# Patient Record
Sex: Female | Born: 1995 | Race: Black or African American | Hispanic: No | Marital: Single | Smoking: Never smoker
Health system: Southern US, Community
[De-identification: ages and names within clinical notes are randomized; demographics above are authoritative.]

## PROBLEM LIST (undated history)

## (undated) DIAGNOSIS — N912 Amenorrhea, unspecified: Secondary | ICD-10-CM

## (undated) DIAGNOSIS — D649 Anemia, unspecified: Secondary | ICD-10-CM

## (undated) DIAGNOSIS — Z8619 Personal history of other infectious and parasitic diseases: Secondary | ICD-10-CM

## (undated) DIAGNOSIS — Z789 Other specified health status: Secondary | ICD-10-CM

## (undated) DIAGNOSIS — B009 Herpesviral infection, unspecified: Secondary | ICD-10-CM

## (undated) HISTORY — DX: Other specified health status: Z78.9

## (undated) HISTORY — DX: Anemia, unspecified: D64.9

## (undated) HISTORY — DX: Herpesviral infection, unspecified: B00.9

## (undated) HISTORY — DX: Personal history of other infectious and parasitic diseases: Z86.19

## (undated) HISTORY — PX: NO PAST SURGERIES: SHX2092

## (undated) HISTORY — DX: Amenorrhea, unspecified: N91.2

---

## 2013-07-13 ENCOUNTER — Inpatient Hospital Stay: Payer: Self-pay | Admitting: Obstetrics and Gynecology

## 2013-07-13 LAB — CBC WITH DIFFERENTIAL/PLATELET
BASOS ABS: 0.1 10*3/uL (ref 0.0–0.1)
Basophil %: 0.6 %
EOS ABS: 0 10*3/uL (ref 0.0–0.7)
Eosinophil %: 0.3 %
HCT: 33.2 % — ABNORMAL LOW (ref 35.0–47.0)
HGB: 11.4 g/dL — AB (ref 12.0–16.0)
LYMPHS ABS: 1.6 10*3/uL (ref 1.0–3.6)
Lymphocyte %: 15.7 %
MCH: 24.4 pg — ABNORMAL LOW (ref 26.0–34.0)
MCHC: 34.3 g/dL (ref 32.0–36.0)
MCV: 71 fL — AB (ref 80–100)
MONOS PCT: 6.2 %
Monocyte #: 0.6 x10 3/mm (ref 0.2–0.9)
NEUTROS ABS: 7.7 10*3/uL — AB (ref 1.4–6.5)
NEUTROS PCT: 77.2 %
Platelet: 215 10*3/uL (ref 150–440)
RBC: 4.67 10*6/uL (ref 3.80–5.20)
RDW: 15.3 % — AB (ref 11.5–14.5)
WBC: 10 10*3/uL (ref 3.6–11.0)

## 2013-07-13 LAB — CREATININE, URINE, RANDOM: Creatinine, Urine Random: 177 mg/dL — ABNORMAL HIGH (ref 30.0–125.0)

## 2013-07-13 LAB — BASIC METABOLIC PANEL
Anion Gap: 6 — ABNORMAL LOW (ref 7–16)
BUN: 9 mg/dL (ref 7–18)
CALCIUM: 8.8 mg/dL — AB (ref 9.0–10.7)
CO2: 23 mmol/L (ref 21–32)
Chloride: 107 mmol/L (ref 98–107)
Creatinine: 0.66 mg/dL (ref 0.60–1.30)
EGFR (African American): >60
EGFR (Non-African Amer.): >60
Glucose: 71 mg/dL (ref 65–99)
Osmolality: 269 (ref 275–301)
Potassium: 4.2 mmol/L (ref 3.5–5.1)
Sodium: 136 mmol/L (ref 136–145)

## 2013-07-13 LAB — SGOT (AST)(ARMC): AST: 30 U/L — AB (ref 0–26)

## 2013-07-13 LAB — GC/CHLAMYDIA PROBE AMP

## 2013-07-13 LAB — PROTEIN, URINE, RANDOM: PROTEIN, RANDOM URINE: 39 mg/dL — AB (ref 0–12)

## 2013-07-13 LAB — URIC ACID: URIC ACID: 3.8 mg/dL (ref 3.0–5.8)

## 2013-07-14 LAB — HEMATOCRIT: HCT: 27.4 % — ABNORMAL LOW (ref 35.0–47.0)

## 2014-09-05 NOTE — H&P (Signed)
L&D Evaluation:  History:  HPI 19 y/[facility] through the night and bloody show. Denies leaking fluid, baby is active. Adolescent, Anemia, +CMZ treated 01/28/13, +RPR titer 1:2. NR-FTA and ANA Antibodies. GBS+ urine and culture   Presents with contractions   Patient's Medical History No Chronic Illness   Patient's Surgical History none   Medications Pre Natal Vitamins   Allergies NKDA   Social History none   Family History Non-Contributory   ROS:  ROS All systems were reviewed.  HEENT, CNS, GI, GU, Respiratory, CV, Renal and Musculoskeletal systems were found to be normal.   Exam:  Vital Signs stable   Urine Protein not completed   General no apparent distress   Mental Status clear   Chest clear   Heart normal sinus rhythm   Abdomen gravid, non-tender   Estimated Fetal Weight Average for gestational age   Fetal Position vtx   Fundal Height term   Back no CVAT   Edema 1+  pedal   Reflexes 1+   Clonus negative   Pelvic no external lesions, 3-4cm BBOW bloody show present VTX @ -2   Mebranes Intact   FHT normal rate with no decels, 120's-130's avg variability with accels   Fetal Heart Rate 136   Ucx irregular, Q2/3 mins 45/60 sec moderate   Skin dry   Lymph no lymphadenopathy   Impression:  Impression early labor, evaluation for PIH   Plan:  Plan monitor contractions and for cervical change, PIH panel, antibiotics for GBBS prophylaxis   Comments PIH labs nl, IV ABX infusing. UC's mild, comfortable for now. DC pain management options available with labor progress. Mom and FOB present and supportive.   Electronic Signatures: Albertina ParrLugiano, Kaleena Corrow B (CNM)  (Signed 18-Mar-15 12:58)  Authored: L&D Evaluation   Last Updated: 18-Mar-15 12:58 by Albertina ParrLugiano, Jashira Cotugno B (CNM)

## 2015-04-29 NOTE — L&D Delivery Note (Signed)
Delivery Note At 8:20 AM a viable female was delivered via Vaginal, Spontaneous Delivery (Presentation: LOA).  APGAR: 8, 9; weight pending.   Placenta status: spontaneous, intact.  Cord: 3vc with the following complications: nuchal cord.   Anesthesia:  fentanyl Episiotomy:  none Lacerations:  periurethral Suture Repair: 3.0 vicryl Est. Blood Loss (mL):  250  Mom to postpartum.  Baby to Couplet care / Skin to Skin.  STINSON, JACOB JEHIEL 03/11/2016, 8:34 AM

## 2015-07-25 ENCOUNTER — Ambulatory Visit (INDEPENDENT_AMBULATORY_CARE_PROVIDER_SITE_OTHER): Payer: Self-pay | Admitting: Obstetrics and Gynecology

## 2015-07-25 VITALS — BP 117/62 | HR 87 | Ht 62.0 in | Wt 123.5 lb

## 2015-07-25 DIAGNOSIS — Z36 Encounter for antenatal screening of mother: Secondary | ICD-10-CM

## 2015-07-25 DIAGNOSIS — Z113 Encounter for screening for infections with a predominantly sexual mode of transmission: Secondary | ICD-10-CM

## 2015-07-25 DIAGNOSIS — Z3490 Encounter for supervision of normal pregnancy, unspecified, unspecified trimester: Secondary | ICD-10-CM

## 2015-07-25 DIAGNOSIS — N912 Amenorrhea, unspecified: Secondary | ICD-10-CM

## 2015-07-25 DIAGNOSIS — Z369 Encounter for antenatal screening, unspecified: Secondary | ICD-10-CM

## 2015-07-25 DIAGNOSIS — Z331 Pregnant state, incidental: Secondary | ICD-10-CM

## 2015-07-25 DIAGNOSIS — Z1389 Encounter for screening for other disorder: Secondary | ICD-10-CM

## 2015-07-25 LAB — POCT URINE PREGNANCY: PREG TEST UR: POSITIVE — AB

## 2015-07-25 NOTE — Patient Instructions (Signed)
Pregnancy and Zika Virus Disease Zika virus disease, or Zika, is an illness that can spread to people from mosquitoes that carry the virus. It may also spread from person to person through infected body fluids. Zika first occurred in [place], but recently it has spread to new areas. The virus occurs in tropical climates. The location of Zika continues to change. Most people who become infected with Zika virus do not develop serious illness. However, Zika may cause birth defects in an unborn baby whose mother is infected with the virus. It may also increase the risk of miscarriage. WHAT ARE THE SYMPTOMS OF ZIKA VIRUS DISEASE? In many cases, people who have been infected with Zika virus do not develop any symptoms. If symptoms appear, they usually start about a week after the person is infected. Symptoms are usually mild. They may include:  Fever.  Rash.  Red eyes.  Joint pain. HOW DOES ZIKA VIRUS DISEASE SPREAD? The main way that Zika virus spreads is through the bite of a certain type of mosquito. Unlike most types of mosquitos, which bite only at night, the type of mosquito that carries Zika virus bites both at night and during the day. Zika virus can also spread through sexual contact, through a blood transfusion, and from a mother to her baby before or during birth. Once you have had Zika virus disease, it is unlikely that you will get it again. CAN I PASS ZIKA TO MY BABY DURING PREGNANCY? Yes, Zika can pass from a mother to her baby before or during birth. WHAT PROBLEMS CAN ZIKA CAUSE FOR MY BABY? A woman who is infected with Zika virus while pregnant is at risk of having her baby born with a condition in which the brain or head is smaller than expected (microcephaly). Babies who have microcephaly can have developmental delays, seizures, hearing problems, and vision problems. Having Zika virus disease during pregnancy can also increase the risk of miscarriage. HOW CAN ZIKA VIRUS DISEASE BE  PREVENTED? There is no vaccine to prevent Zika. The best way to prevent the disease is to avoid infected mosquitoes and avoid exposure to body fluids that can spread the virus. Avoid any possible exposure to Zika by taking the following precautions. For women and their sex partners:  Avoid traveling to high-risk areas. The locations where Zika is being reported change often. To identify high-risk areas, check the CDC travel website: www.cdc.gov/zika/geo/index.html  If you or your sex partner must travel to a high-risk area, talk with a health care provider before and after traveling.  Take all precautions to avoid mosquito bites if you live in, or travel to, any of the high-risk areas. Insect repellents are safe to use during pregnancy.  Ask your health care provider when it is safe to have sexual contact. For women:  If you are pregnant or trying to become pregnant, avoid sexual contact with persons who may have been exposed to Zika virus, persons who have possible symptoms of Zika, or persons whose history you are unsure about. If you choose to have sexual contact with someone who may have been exposed to Zika virus, use condoms correctly during the entire duration of sexual activity, every time. Do not share sexual devices, as you may be exposed to body fluids.  Ask your health care provider about when it is safe to attempt pregnancy after a possible exposure to Zika virus. WHAT STEPS SHOULD I TAKE TO AVOID MOSQUITO BITES? Take these steps to avoid mosquito bites when you are   in a high-risk area:  Wear loose clothing that covers your arms and legs.  Limit your outdoor activities.  Do not open windows unless they have window screens.  Sleep under mosquito nets.  Use insect repellent. The best insect repellents have:  DEET, picaridin, oil of lemon eucalyptus (OLE), or IR3535 in them.  Higher amounts of an active ingredient in them.  Remember that insect repellents are safe to use  during pregnancy.  Do not use OLE on children who are younger than 3 years of age. Do not use insect repellent on babies who are younger than 2 months of age.  Cover your child's stroller with mosquito netting. Make sure the netting fits snugly and that any loose netting does not cover your child's mouth or nose. Do not use a blanket as a mosquito-protection cover.  Do not apply insect repellent underneath clothing.  If you are using sunscreen, apply the sunscreen before applying the insect repellent.  Treat clothing with permethrin. Do not apply permethrin directly to your skin. Follow label directions for safe use.  Get rid of standing water, where mosquitoes may reproduce. Standing water is often found in items such as buckets, bowls, animal food dishes, and flowerpots. When you return from traveling to any high-risk area, continue taking actions to protect yourself against mosquito bites for 3 weeks, even if you show no signs of illness. This will prevent spreading Zika virus to uninfected mosquitoes. WHAT SHOULD I KNOW ABOUT THE SEXUAL TRANSMISSION OF ZIKA? People can spread Zika to their sexual partners during vaginal, anal, or oral sex, or by sharing sexual devices. Many people with Zika do not develop symptoms, so a person could spread the disease without knowing that they are infected. The greatest risk is to women who are pregnant or who may become pregnant. Zika virus can live longer in semen than it can live in blood. Couples can prevent sexual transmission of the virus by:  Using condoms correctly during the entire duration of sexual activity, every time. This includes vaginal, anal, and oral sex.  Not sharing sexual devices. Sharing increases your risk of being exposed to body fluid from another person.  Avoiding all sexual activity until your health care provider says it is safe. SHOULD I BE TESTED FOR ZIKA VIRUS? A sample of your blood can be tested for Zika virus. A pregnant  woman should be tested if she may have been exposed to the virus or if she has symptoms of Zika. She may also have additional tests done during her pregnancy, such ultrasound testing. Talk with your health care provider about which tests are recommended.   This information is not intended to replace advice given to you by your health care provider. Make sure you discuss any questions you have with your health care provider.   Document Released: 01/03/2015 Document Reviewed: 12/27/2014 Elsevier Interactive Patient Education 2016 Elsevier Inc. Minor Illnesses and Medications in Pregnancy  Cold/Flu:  Sudafed for congestion- Robitussin (plain) for cough- Tylenol for discomfort.  Please follow the directions on the label.  Try not to take any more than needed.  OTC Saline nasal spray and air humidifier or cool-mist  Vaporizer to sooth nasal irritation and to loosen congestion.  It is also important to increase intake of non carbonated fluids, especially if you have a fever.  Constipation:  Colace-2 capsules at bedtime; Metamucil- follow directions on label; Senokot- 1 tablet at bedtime.  Any one of these medications can be used.  It is also   very important to increase fluids and fruits along with regular exercise.  If problem persists please call the office.  Diarrhea:  Kaopectate as directed on the label.  Eat a bland diet and increase fluids.  Avoid highly seasoned foods.  Headache:  Tylenol 1 or 2 tablets every 3-4 hours as needed  Indigestion:  Maalox, Mylanta, Tums or Rolaids- as directed on label.  Also try to eat small meals and avoid fatty, greasy or spicy foods.  Nausea with or without Vomiting:  Nausea in pregnancy is caused by increased levels of hormones in the body which influence the digestive system and cause irritation when stomach acids accumulate.  Symptoms usually subside after 1st trimester of pregnancy.  Try the following:  Keep saltines, graham crackers or dry toast by your bed  to eat upon awakening.  Don't let your stomach get empty.  Try to eat 5-6 small meals per day instead of 3 large ones.  Avoid greasy fatty or highly seasoned foods.   Take OTC Unisom 1 tablet at bed time along with OTC Vitamin B6 25-50 mg 3 times per day.    If nausea continues with vomiting and you are unable to keep down food and fluids you may need a prescription medication.  Please notify your provider.   Sore throat:  Chloraseptic spray, throat lozenges and or plain Tylenol.  Vaginal Yeast Infection:  OTC Monistat for 7 days as directed on label.  If symptoms do not resolve within a week notify provider.  If any of the above problems do not subside with recommended treatment please call the office for further assistance.   Do not take Aspirin, Advil, Motrin or Ibuprofen.  * * OTC= Over the counter Hyperemesis Gravidarum Hyperemesis gravidarum is a severe form of nausea and vomiting that happens during pregnancy. Hyperemesis is worse than morning sickness. It may cause you to have nausea or vomiting all day for many days. It may keep you from eating and drinking enough food and liquids. Hyperemesis usually occurs during the first half (the first 20 weeks) of pregnancy. It often goes away once a woman is in her second half of pregnancy. However, sometimes hyperemesis continues through an entire pregnancy.  CAUSES  The cause of this condition is not completely known but is thought to be related to changes in the body's hormones when pregnant. It could be from the high level of the pregnancy hormone or an increase in estrogen in the body.  SIGNS AND SYMPTOMS   Severe nausea and vomiting.  Nausea that does not go away.  Vomiting that does not allow you to keep any food down.  Weight loss and body fluid loss (dehydration).  Having no desire to eat or not liking food you have previously enjoyed. DIAGNOSIS  Your health care provider will do a physical exam and ask you about your symptoms.  He or she may also order blood tests and urine tests to make sure something else is not causing the problem.  TREATMENT  You may only need medicine to control the problem. If medicines do not control the nausea and vomiting, you will be treated in the hospital to prevent dehydration, increased acid in the blood (acidosis), weight loss, and changes in the electrolytes in your body that may harm the unborn baby (fetus). You may need IV fluids.  HOME CARE INSTRUCTIONS   Only take over-the-counter or prescription medicines as directed by your health care provider.  Try eating a couple of dry crackers or   toast in the morning before getting out of bed.  Avoid foods and smells that upset your stomach.  Avoid fatty and spicy foods.  Eat 5-6 small meals a day.  Do not drink when eating meals. Drink between meals.  For snacks, eat high-protein foods, such as cheese.  Eat or suck on things that have ginger in them. Ginger helps nausea.  Avoid food preparation. The smell of food can spoil your appetite.  Avoid iron pills and iron in your multivitamins until after 3-4 months of being pregnant. However, consult with your health care provider before stopping any prescribed iron pills. SEEK MEDICAL CARE IF:   Your abdominal pain increases.  You have a severe headache.  You have vision problems.  You are losing weight. SEEK IMMEDIATE MEDICAL CARE IF:   You are unable to keep fluids down.  You vomit blood.  You have constant nausea and vomiting.  You have excessive weakness.  You have extreme thirst.  You have dizziness or fainting.  You have a fever or persistent symptoms for more than 2-3 days.  You have a fever and your symptoms suddenly get worse. MAKE SURE YOU:   Understand these instructions.  Will watch your condition.  Will get help right away if you are not doing well or get worse.   This information is not intended to replace advice given to you by your health care  provider. Make sure you discuss any questions you have with your health care provider.   Document Released: 04/14/2005 Document Revised: 02/02/2013 Document Reviewed: 11/24/2012 Elsevier Interactive Patient Education 2016 Elsevier Inc. Commonly Asked Questions During Pregnancy  Cats: A parasite can be excreted in cat feces.  To avoid exposure you need to have another person empty the little box.  If you must empty the litter box you will need to wear gloves.  Wash your hands after handling your cat.  This parasite can also be found in raw or undercooked meat so this should also be avoided.  Colds, Sore Throats, Flu: Please check your medication sheet to see what you can take for symptoms.  If your symptoms are unrelieved by these medications please call the office.  Dental Work: Most any dental work your dentist recommends is permitted.  X-rays should only be taken during the first trimester if absolutely necessary.  Your abdomen should be shielded with a lead apron during all x-rays.  Please notify your provider prior to receiving any x-rays.  Novocaine is fine; gas is not recommended.  If your dentist requires a note from us prior to dental work please call the office and we will provide one for you.  Exercise: Exercise is an important part of staying healthy during your pregnancy.  You may continue most exercises you were accustomed to prior to pregnancy.  Later in your pregnancy you will most likely notice you have difficulty with activities requiring balance like riding a bicycle.  It is important that you listen to your body and avoid activities that put you at a higher risk of falling.  Adequate rest and staying well hydrated are a must!  If you have questions about the safety of specific activities ask your provider.    Exposure to Children with illness: Try to avoid obvious exposure; report any symptoms to us when noted,  If you have chicken pos, red measles or mumps, you should be immune to  these diseases.   Please do not take any vaccines while pregnant unless you have checked with   your OB provider.  Fetal Movement: After 28 weeks we recommend you do "kick counts" twice daily.  Lie or sit down in a calm quiet environment and count your baby movements "kicks".  You should feel your baby at least 10 times per hour.  If you have not felt 10 kicks within the first hour get up, walk around and have something sweet to eat or drink then repeat for an additional hour.  If count remains less than 10 per hour notify your provider.  Fumigating: Follow your pest control agent's advice as to how long to stay out of your home.  Ventilate the area well before re-entering.  Hemorrhoids:   Most over-the-counter preparations can be used during pregnancy.  Check your medication to see what is safe to use.  It is important to use a stool softener or fiber in your diet and to drink lots of liquids.  If hemorrhoids seem to be getting worse please call the office.   Hot Tubs:  Hot tubs Jacuzzis and saunas are not recommended while pregnant.  These increase your internal body temperature and should be avoided.  Intercourse:  Sexual intercourse is safe during pregnancy as long as you are comfortable, unless otherwise advised by your provider.  Spotting may occur after intercourse; report any bright red bleeding that is heavier than spotting.  Labor:  If you know that you are in labor, please go to the hospital.  If you are unsure, please call the office and let us help you decide what to do.  Lifting, straining, etc:  If your job requires heavy lifting or straining please check with your provider for any limitations.  Generally, you should not lift items heavier than that you can lift simply with your hands and arms (no back muscles)  Painting:  Paint fumes do not harm your pregnancy, but may make you ill and should be avoided if possible.  Latex or water based paints have less odor than oils.  Use adequate  ventilation while painting.  Permanents & Hair Color:  Chemicals in hair dyes are not recommended as they cause increase hair dryness which can increase hair loss during pregnancy.  " Highlighting" and permanents are allowed.  Dye may be absorbed differently and permanents may not hold as well during pregnancy.  Sunbathing:  Use a sunscreen, as skin burns easily during pregnancy.  Drink plenty of fluids; avoid over heating.  Tanning Beds:  Because their possible side effects are still unknown, tanning beds are not recommended.  Ultrasound Scans:  Routine ultrasounds are performed at approximately 20 weeks.  You will be able to see your baby's general anatomy an if you would like to know the gender this can usually be determined as well.  If it is questionable when you conceived you may also receive an ultrasound early in your pregnancy for dating purposes.  Otherwise ultrasound exams are not routinely performed unless there is a medical necessity.  Although you can request a scan we ask that you pay for it when conducted because insurance does not cover " patient request" scans.  Work: If your pregnancy proceeds without complications you may work until your due date, unless your physician or employer advises otherwise.  Round Ligament Pain/Pelvic Discomfort:  Sharp, shooting pains not associated with bleeding are fairly common, usually occurring in the second trimester of pregnancy.  They tend to be worse when standing up or when you remain standing for long periods of time.  These are the result   of pressure of certain pelvic ligaments called "round ligaments".  Rest, Tylenol and heat seem to be the most effective relief.  As the womb and fetus grow, they rise out of the pelvis and the discomfort improves.  Please notify the office if your pain seems different than that described.  It may represent a more serious condition.   

## 2015-07-25 NOTE — Progress Notes (Signed)
Tracy BarcelonaQuadasia L Moses presents for NOB nurse interview visit. G-2.  P-1001. Had pregnancy confirmation at [facility] but forgot it. UPT here at office Positive. Pregnancy education material explained and given. No cats in the home. NOB labs ordered.  HIV labs and Drug screen were explained optional and she could opt out of tests but did not decline. Drug screen ordered. PNV encouraged. NT to discuss with provider. Pt c/o nausea and was told to take B6 3xd and Unisom at night, given diet instructions such as snacks, etc. and if this does not help to contact office. Pt. To follow up with provider in 4 weeks for NOB physical.  All questions answered.   ZIKA EXPOSURE SCREEN:  The patient has not traveled to a [place] endemic area within the past 6 months, nor has she had unprotected sex with a partner who has travelled to a [place] endemic region within the past 6 months. The patient has been advised to notify us if these factors change any time during this current pregnancy, so adequate testing and monitoring can be initiated.

## 2015-07-26 ENCOUNTER — Other Ambulatory Visit: Payer: Self-pay | Admitting: Obstetrics and Gynecology

## 2015-07-26 DIAGNOSIS — O9989 Other specified diseases and conditions complicating pregnancy, childbirth and the puerperium: Principal | ICD-10-CM

## 2015-07-26 DIAGNOSIS — O09899 Supervision of other high risk pregnancies, unspecified trimester: Secondary | ICD-10-CM

## 2015-07-26 DIAGNOSIS — Z9289 Personal history of other medical treatment: Secondary | ICD-10-CM | POA: Insufficient documentation

## 2015-07-26 DIAGNOSIS — Z283 Underimmunization status: Secondary | ICD-10-CM

## 2015-07-27 LAB — CBC WITH DIFFERENTIAL/PLATELET
Basophils Absolute: 0 10*3/uL (ref 0.0–0.2)
Basos: 0 %
EOS (ABSOLUTE): 0 10*3/uL (ref 0.0–0.4)
Eos: 0 %
Hematocrit: 32.8 % — ABNORMAL LOW (ref 34.0–46.6)
Hemoglobin: 10.5 g/dL — ABNORMAL LOW (ref 11.1–15.9)
IMMATURE GRANULOCYTES: 0 %
Immature Grans (Abs): 0 10*3/uL (ref 0.0–0.1)
LYMPHS ABS: 2.5 10*3/uL (ref 0.7–3.1)
Lymphs: 35 %
MCH: 22.5 pg — ABNORMAL LOW (ref 26.6–33.0)
MCHC: 32 g/dL (ref 31.5–35.7)
MCV: 70 fL — AB (ref 79–97)
MONOS ABS: 0.5 10*3/uL (ref 0.1–0.9)
Monocytes: 6 %
Neutrophils Absolute: 4.2 10*3/uL (ref 1.4–7.0)
Neutrophils: 59 %
PLATELETS: 237 10*3/uL (ref 150–379)
RBC: 4.66 x10E6/uL (ref 3.77–5.28)
RDW: 15.4 % (ref 12.3–15.4)
WBC: 7.2 10*3/uL (ref 3.4–10.8)

## 2015-07-27 LAB — PAIN MGT SCRN (14 DRUGS), UR
Amphetamine Screen, Ur: NEGATIVE ng/mL
BARBITURATE SCRN UR: NEGATIVE ng/mL
BENZODIAZEPINE SCREEN, URINE: NEGATIVE ng/mL
BUPRENORPHINE, URINE: NEGATIVE ng/mL
CANNABINOIDS UR QL SCN: POSITIVE ng/mL
COCAINE(METAB.) SCREEN, URINE: NEGATIVE ng/mL
Creatinine(Crt), U: 114.4 mg/dL (ref 20.0–300.0)
Fentanyl, Urine: NEGATIVE pg/mL
MEPERIDINE SCREEN, URINE: NEGATIVE ng/mL
METHADONE SCREEN, URINE: NEGATIVE ng/mL
OPIATE SCRN UR: NEGATIVE ng/mL
Oxycodone+Oxymorphone Ur Ql Scn: NEGATIVE ng/mL
PCP Scrn, Ur: NEGATIVE ng/mL
PROPOXYPHENE SCREEN: NEGATIVE ng/mL
Ph of Urine: 8.6 (ref 4.5–8.9)
Tramadol Ur Ql Scn: NEGATIVE ng/mL

## 2015-07-27 LAB — URINALYSIS, ROUTINE W REFLEX MICROSCOPIC
BILIRUBIN UA: NEGATIVE
GLUCOSE, UA: NEGATIVE
KETONES UA: NEGATIVE
Leukocytes, UA: NEGATIVE
NITRITE UA: NEGATIVE
RBC UA: NEGATIVE
SPEC GRAV UA: 1.026 (ref 1.005–1.030)
UUROB: 1 mg/dL (ref 0.2–1.0)
pH, UA: 8 — ABNORMAL HIGH (ref 5.0–7.5)

## 2015-07-27 LAB — HEPATITIS B SURFACE ANTIGEN: Hepatitis B Surface Ag: NEGATIVE

## 2015-07-27 LAB — RPR: RPR Ser Ql: REACTIVE — AB

## 2015-07-27 LAB — NICOTINE SCREEN, URINE: COTININE UR QL SCN: NEGATIVE ng/mL

## 2015-07-27 LAB — GC/CHLAMYDIA PROBE AMP
CHLAMYDIA, DNA PROBE: NEGATIVE
NEISSERIA GONORRHOEAE BY PCR: NEGATIVE

## 2015-07-27 LAB — RH TYPE: Rh Factor: POSITIVE

## 2015-07-27 LAB — HIV ANTIBODY (ROUTINE TESTING W REFLEX): HIV Screen 4th Generation wRfx: NONREACTIVE

## 2015-07-27 LAB — RUBELLA ANTIBODY, IGM: Rubella IgM: <20 AU/mL (ref 0.0–19.9)

## 2015-07-27 LAB — ABO

## 2015-07-27 LAB — RPR, QUANT+TP ABS (REFLEX): T Pallidum Abs: NEGATIVE

## 2015-07-27 LAB — URINE CULTURE, OB REFLEX

## 2015-07-27 LAB — VARICELLA ZOSTER ANTIBODY, IGM: VARICELLA IGM: 1.48 {index} — AB (ref 0.00–0.90)

## 2015-07-27 LAB — CULTURE, OB URINE

## 2015-07-27 LAB — SICKLE CELL SCREEN: SICKLE CELL SCREEN: NEGATIVE

## 2015-07-27 LAB — ANTIBODY SCREEN: Antibody Screen: NEGATIVE

## 2015-07-30 ENCOUNTER — Emergency Department
Admission: EM | Admit: 2015-07-30 | Discharge: 2015-07-30 | Disposition: A | Discharge status: Home / Self Care | Payer: BLUE CROSS/BLUE SHIELD | Attending: Emergency Medicine | Admitting: Emergency Medicine

## 2015-07-30 ENCOUNTER — Encounter: Payer: Self-pay | Admitting: Emergency Medicine

## 2015-07-30 DIAGNOSIS — J029 Acute pharyngitis, unspecified: Secondary | ICD-10-CM | POA: Diagnosis present

## 2015-07-30 DIAGNOSIS — J0100 Acute maxillary sinusitis, unspecified: Secondary | ICD-10-CM | POA: Diagnosis not present

## 2015-07-30 DIAGNOSIS — H6123 Impacted cerumen, bilateral: Secondary | ICD-10-CM | POA: Diagnosis not present

## 2015-07-30 LAB — POCT RAPID STREP A: Streptococcus, Group A Screen (Direct): NEGATIVE

## 2015-07-30 MED ORDER — LIDOCAINE VISCOUS 2 % MT SOLN: 5.0000 mL | Freq: Four times a day (QID) | OROMUCOSAL | Status: DC | PRN

## 2015-07-30 MED ORDER — AMOXICILLIN 500 MG PO CAPS: 500.0000 mg | ORAL_CAPSULE | Freq: Three times a day (TID) | ORAL | Status: DC

## 2015-07-30 MED ORDER — PSEUDOEPH-BROMPHEN-DM 30-2-10 MG/5ML PO SYRP: 5.0000 mL | ORAL_SOLUTION | Freq: Four times a day (QID) | ORAL | Status: DC | PRN

## 2015-07-30 NOTE — Discharge Instructions (Signed)
Cerumen Impaction The structures of the external ear canal secrete a waxy substance known as cerumen. Excess cerumen can build up in the ear canal, causing a condition known as cerumen impaction. Cerumen impaction can cause ear pain and disrupt the function of the ear. The rate of cerumen production differs for each individual. In certain individuals, the configuration of the ear canal may decrease his or her ability to naturally remove cerumen. CAUSES Cerumen impaction is caused by excessive cerumen production or buildup. RISK FACTORS  Frequent use of swabs to clean ears.  Having narrow ear canals.  Having eczema.  Being dehydrated. SIGNS AND SYMPTOMS  Diminished hearing.  Ear drainage.  Ear pain.  Ear itch. TREATMENT Treatment may involve:  Over-the-counter or prescription ear drops to soften the cerumen.  Removal of cerumen by a health care provider. This may be done with:  Irrigation with warm water. This is the most common method of removal.  Ear curettes and other instruments.  Surgery. This may be done in severe cases. HOME CARE INSTRUCTIONS  Take medicines only as directed by your health care provider.  Do not insert objects into the ear with the intent of cleaning the ear. PREVENTION  Do not insert objects into the ear, even with the intent of cleaning the ear. Removing cerumen as a part of normal hygiene is not necessary, and the use of swabs in the ear canal is not recommended.  Drink enough water to keep your urine clear or pale yellow.  Control your eczema if you have it. SEEK MEDICAL CARE IF:  You develop ear pain.  You develop bleeding from the ear.  The cerumen does not clear after you use ear drops as directed.   This information is not intended to replace advice given to you by your health care provider. Make sure you discuss any questions you have with your health care provider.   Document Released: 05/22/2004 Document Revised: 05/05/2014  Document Reviewed: 11/29/2014 Elsevier Interactive Patient Education 2016 [employer]vier Inc.  

## 2015-07-30 NOTE — ED Notes (Signed)
Reports sore throat and right ear pain

## 2015-07-30 NOTE — ED Provider Notes (Signed)
[facility] Emergency Department Provider Note  ____________________________________________  Time seen: Approximately 11:31 AM  I have reviewed the triage vital signs and the nursing notes.   HISTORY  Chief Complaint Sore Throat    HPI Tracy Moses is a 20 y.o. female patient complaining of sinus congestion, right ear pain and sore throat5 days. Patient also states that she's having hearing loss which she described as"muffle" sounds. Patient denies any fever with this complaint. Patient denies any nausea vomiting diarrhea. Patient states that had the flu shot this season. No palliative measures taken for this complaint. Patient denies any pain at this time.   Past Medical History  Diagnosis Date  . Amenorrhea   . Hx of chlamydia infection     Patient Active Problem List   Diagnosis Date Noted  . Rubella non-immune status, antepartum 07/26/2015  . History of RPR test 07/26/2015    History reviewed. No pertinent past surgical history.  Current Outpatient Rx  Name  Route  Sig  Dispense  Refill  . amoxicillin (AMOXIL) 500 MG capsule   Oral   Take 1 capsule (500 mg total) by mouth 3 (three) times daily.   30 capsule   0   . brompheniramine-pseudoephedrine-DM 30-2-10 MG/5ML syrup   Oral   Take 5 mLs by mouth 4 (four) times daily as needed. Mixed with 5 mL of viscous lidocaine for swish and small   120 mL   0   . lidocaine (XYLOCAINE) 2 % solution   Mouth/Throat   Use as directed 5 mLs in the mouth or throat every 6 (six) hours as needed for mouth pain. Mixed with 5 mL of Bromfed DM for swish and swallow.   100 mL   0   . PRENATAL 28-0.8 MG TABS   Oral   Take by mouth.           Allergies Review of patient's allergies indicates no known allergies.  Family History  Problem Relation Age of Onset  . Breast cancer Maternal Grandmother     Social History Social History  Substance Use Topics  . Smoking status: Never Smoker   .  Smokeless tobacco: Never Used  . Alcohol Use: No    Review of Systems Constitutional: No fever/chills Eyes: No visual changes. ENT: Sore throat. Right ear pain. Sinus congestion. Decreased hearing Cardiovascular: Denies chest pain. Respiratory: Denies shortness of breath. Gastrointestinal: No abdominal pain.  No nausea, no vomiting.  No diarrhea.  No constipation. Genitourinary: Negative for dysuria. Musculoskeletal: Negative for back pain. Skin: Negative for rash. Neurological: Negative for headaches, focal weakness or numbness.    ____________________________________________   PHYSICAL EXAM:  VITAL SIGNS: ED Triage Vitals  Enc Vitals Group     BP 07/30/15 0931 120/72 mmHg     Pulse Rate 07/30/15 0931 61     Resp 07/30/15 0931 16     Temp 07/30/15 0931 98.6 F (37 C)     Temp Source 07/30/15 0931 Oral     SpO2 07/30/15 0931 99 %     Weight 07/30/15 0931 135 lb (61.236 kg)     Height 07/30/15 0931 5\' 1"  (1.549 m)     Head Cir --      Peak Flow --      Pain Score --      Pain Loc --      Pain Edu? --      Excl. in GC? --     Constitutional: Alert and oriented. Well  appearing and in no acute distress. Eyes: Conjunctivae are normal. PERRL. EOMI. Head: Atraumatic. Nose:Bilateral maxillary guarding. Edematous nasal turbinates. Thick yellowish greenish nasal discharge. Mouth/Throat: Mucous membranes are moist.  Oropharynx erythematous.Postnasal drainage without exudative tonsils. Bilateral TMs not visible secondary to impaction. Neck: No stridor.  No cervical spine tenderness to palpation. Hematological/Lymphatic/Immunilogical: No cervical lymphadenopathy. Cardiovascular: Normal rate, regular rhythm. Grossly normal heart sounds.  Good peripheral circulation. Respiratory: Normal respiratory effort.  No retractions. Lungs CTAB. Gastrointestinal: Soft and nontender. No distention. No abdominal bruits. No CVA tenderness. Musculoskeletal: No lower extremity tenderness nor  edema.  No joint effusions. Neurologic:  Normal speech and language. No gross focal neurologic deficits are appreciated. No gait instability. Skin:  Skin is warm, dry and intact. No rash noted. Psychiatric: Mood and affect are normal. Speech and behavior are normal.  ____________________________________________   LABS (all labs ordered are listed, but only abnormal results are displayed)  Labs Reviewed  POCT RAPID STREP A   ____________________________________________  EKG   ____________________________________________  RADIOLOGY   ____________________________________________   PROCEDURES  Procedure(s) performed: None  Critical Care performed: No  ____________________________________________   INITIAL IMPRESSION / ASSESSMENT AND PLAN / ED COURSE  Pertinent labs & imaging results that were available during my care of the patient were reviewed by me and considered in my medical decision making (see chart for details).  Maxillary sinusitis, pharyngitis, Cerumen impaction. Discussed negative rapid strep test with patient. Patient had bilateral ear irrigation to remove impaction. Patient states increasing hearing. Patient given discharge Instructions and [facility]. Patient given a work note.  FINAL CLINICAL IMPRESSION(S) / ED DIAGNOSES  Final diagnoses:  Subacute maxillary sinusitis  Acute pharyngitis, unspecified pharyngitis type  Cerumen debris on tympanic membrane, bilateral       Joni Reining, PA-C 07/30/15 1150  Emily Filbert, MD 07/31/15 [phone]

## 2015-07-31 ENCOUNTER — Other Ambulatory Visit: Payer: Self-pay | Admitting: Obstetrics and Gynecology

## 2015-07-31 DIAGNOSIS — Z9289 Personal history of other medical treatment: Secondary | ICD-10-CM

## 2015-07-31 DIAGNOSIS — F1290 Cannabis use, unspecified, uncomplicated: Secondary | ICD-10-CM | POA: Insufficient documentation

## 2015-08-28 ENCOUNTER — Encounter: Payer: Self-pay | Admitting: Obstetrics and Gynecology

## 2015-08-28 ENCOUNTER — Ambulatory Visit (INDEPENDENT_AMBULATORY_CARE_PROVIDER_SITE_OTHER): Payer: Medicaid Other | Admitting: Obstetrics and Gynecology

## 2015-08-28 VITALS — BP 123/69 | HR 98 | Wt 125.3 lb

## 2015-08-28 DIAGNOSIS — O09899 Supervision of other high risk pregnancies, unspecified trimester: Secondary | ICD-10-CM

## 2015-08-28 DIAGNOSIS — F1290 Cannabis use, unspecified, uncomplicated: Secondary | ICD-10-CM

## 2015-08-28 DIAGNOSIS — Z283 Underimmunization status: Secondary | ICD-10-CM

## 2015-08-28 DIAGNOSIS — Z331 Pregnant state, incidental: Secondary | ICD-10-CM

## 2015-08-28 DIAGNOSIS — F1210 Cannabis abuse, uncomplicated: Secondary | ICD-10-CM

## 2015-08-28 DIAGNOSIS — O9989 Other specified diseases and conditions complicating pregnancy, childbirth and the puerperium: Secondary | ICD-10-CM

## 2015-08-28 DIAGNOSIS — Z9289 Personal history of other medical treatment: Secondary | ICD-10-CM

## 2015-08-28 LAB — POCT URINALYSIS DIPSTICK
Bilirubin, UA: NEGATIVE
Blood, UA: NEGATIVE
GLUCOSE UA: NEGATIVE
Ketones, UA: NEGATIVE
Nitrite, UA: NEGATIVE
Protein, UA: NEGATIVE
SPEC GRAV UA: 1.015
UROBILINOGEN UA: 0.2
pH, UA: 6.5

## 2015-08-28 NOTE — Progress Notes (Signed)
NEW OB HISTORY AND PHYSICAL  SUBJECTIVE:       Tracy BarcelonaQuadasia L Feeser is a 20 y.o. [id number] female, Patient's last menstrual period was 06/02/2015 (exact date)., Estimated Date of Delivery: 03/08/16, 7734w3d, presents today for establishment of Prenatal Care. She has no unusual complaints and complains of nothing      Gynecologic History Patient's last menstrual period was 06/02/2015 (exact date). Normal Contraception: none Last Pap: ?Marland Kitchen. Results were: normal  Obstetric History OB History  Gravida Para Term Preterm AB SAB TAB Ectopic Multiple Living  2 1 1       1     # Outcome Date GA Lbr Len/2nd Weight Sex Delivery Anes PTL Lv  2 Current           1 Term 07/13/13 2871w0d  5 lb 13 oz (2.637 kg) F Vag-Spont  N Y      Past Medical History  Diagnosis Date  . Amenorrhea   . Hx of chlamydia infection     History reviewed. No pertinent past surgical history.  Current Outpatient Prescriptions on File Prior to Visit  Medication Sig Dispense Refill  . PRENATAL 28-0.8 MG TABS Take by mouth.    Marland Kitchen. amoxicillin (AMOXIL) 500 MG capsule Take 1 capsule (500 mg total) by mouth 3 (three) times daily. (Patient not taking: Reported on 08/28/2015) 30 capsule 0  . brompheniramine-pseudoephedrine-DM 30-2-10 MG/5ML syrup Take 5 mLs by mouth 4 (four) times daily as needed. Mixed with 5 mL of viscous lidocaine for swish and small (Patient not taking: Reported on 08/28/2015) 120 mL 0  . lidocaine (XYLOCAINE) 2 % solution Use as directed 5 mLs in the mouth or throat every 6 (six) hours as needed for mouth pain. Mixed with 5 mL of Bromfed DM for swish and swallow. (Patient not taking: Reported on 08/28/2015) 100 mL 0   No current facility-administered medications on file prior to visit.    No Known Allergies  Social History   Social History  . Marital Status: Single    Spouse Name: N/A  . Number of Children: N/A  . Years of Education: N/A   Occupational History  . picker socks [employer]   Social History  Main Topics  . Smoking status: Never Smoker   . Smokeless tobacco: Never Used  . Alcohol Use: No  . Drug Use: Yes    Special: Marijuana     Comment: used approximately 1 month ago before she knew she was pregnant and none since.  . Sexual Activity:    Partners: Male   Other Topics Concern  . Not on file   Social History Narrative    Family History  Problem Relation Age of Onset  . Breast cancer Maternal Grandmother     The following portions of the patient's history were reviewed and updated as appropriate: allergies, current medications, past OB history, past medical history, past surgical history, past family history, past social history, and problem list.    OBJECTIVE: Initial Physical Exam (New OB)  GENERAL APPEARANCE: alert, well appearing, in no apparent distress, oriented to person, place and time HEAD: normocephalic, atraumatic MOUTH: mucous membranes moist, pharynx normal without lesions THYROID: no thyromegaly or masses present BREASTS: not examined LUNGS: clear to auscultation, no wheezes, rales or rhonchi, symmetric air entry HEART: regular rate and rhythm, no murmurs ABDOMEN: soft, nontender, nondistended, no abnormal masses, no epigastric pain, fundus not palpable and FHT present EXTREMITIES: no redness or tenderness in the calves or thighs SKIN: normal coloration and turgor, no  rashes LYMPH NODES: no adenopathy palpable NEUROLOGIC: alert, oriented, normal speech, no focal findings or movement disorder noted  PELVIC EXAM not indicated  ASSESSMENT: Normal pregnancy False + RPR H/O MJ use- quit  PLAN: Prenatal care Declines genetic screening See orders

## 2015-08-28 NOTE — Progress Notes (Signed)
NOB physical- pt is doing well, denies any complaints 

## 2015-09-13 ENCOUNTER — Encounter: Payer: Self-pay | Admitting: Emergency Medicine

## 2015-09-13 DIAGNOSIS — J029 Acute pharyngitis, unspecified: Secondary | ICD-10-CM | POA: Diagnosis not present

## 2015-09-13 DIAGNOSIS — B2790 Infectious mononucleosis, unspecified without complication: Secondary | ICD-10-CM | POA: Diagnosis not present

## 2015-09-13 DIAGNOSIS — Z3A14 14 weeks gestation of pregnancy: Secondary | ICD-10-CM | POA: Diagnosis not present

## 2015-09-13 DIAGNOSIS — Z792 Long term (current) use of antibiotics: Secondary | ICD-10-CM | POA: Diagnosis not present

## 2015-09-13 DIAGNOSIS — O99512 Diseases of the respiratory system complicating pregnancy, second trimester: Secondary | ICD-10-CM | POA: Insufficient documentation

## 2015-09-13 DIAGNOSIS — R51 Headache: Secondary | ICD-10-CM | POA: Diagnosis present

## 2015-09-13 LAB — POCT RAPID STREP A: STREPTOCOCCUS, GROUP A SCREEN (DIRECT): NEGATIVE

## 2015-09-13 NOTE — ED Notes (Addendum)
Pt had been previously called from lobby to be triage with no reply and it was assumed pt had left. Pt to STAT desk at this time asking how long before she will be seen. Pt name/chart placed back in lobby waiting room in Epic and situation explained to pt and family member.

## 2015-09-13 NOTE — ED Notes (Signed)
Pt presents to ED with headache and sore throat since last night. Denies fever.

## 2015-09-14 ENCOUNTER — Telehealth: Payer: Self-pay | Admitting: Obstetrics and Gynecology

## 2015-09-14 ENCOUNTER — Emergency Department
Admission: EM | Admit: 2015-09-14 | Discharge: 2015-09-14 | Disposition: A | Discharge status: Home / Self Care | Payer: BLUE CROSS/BLUE SHIELD | Attending: Emergency Medicine | Admitting: Emergency Medicine

## 2015-09-14 DIAGNOSIS — B2790 Infectious mononucleosis, unspecified without complication: Secondary | ICD-10-CM

## 2015-09-14 DIAGNOSIS — J029 Acute pharyngitis, unspecified: Secondary | ICD-10-CM

## 2015-09-14 LAB — MONONUCLEOSIS SCREEN: Mono Screen: POSITIVE — AB

## 2015-09-14 MED ORDER — LIDOCAINE VISCOUS 2 % MT SOLN
OROMUCOSAL | Status: AC
  Administered 2015-09-14: 15 mL via OROMUCOSAL
  Filled 2015-09-14: qty 15

## 2015-09-14 MED ORDER — LIDOCAINE VISCOUS 2 % MT SOLN
15.0000 mL | Freq: Once | OROMUCOSAL | Status: AC
  Administered 2015-09-14: 15 mL via OROMUCOSAL

## 2015-09-14 NOTE — Telephone Encounter (Signed)
THIS PT IS 15 WK PREGANANT // WENT TO ER AND THEY SAID SHE HAS MONONUCLEOSIS// THEY TOLD HER TO CALL HER SO WE CAN PRESCRIBE AN ANTIBIOTIC

## 2015-09-14 NOTE — Telephone Encounter (Signed)
pls advise

## 2015-09-14 NOTE — ED Provider Notes (Signed)
[facility] Emergency Department Provider Note  ____________________________________________  Time seen: 12:15 AM  I have reviewed the triage vital signs and the nursing notes.   HISTORY  Chief Complaint Headache and Sore Throat    HPI Tracy Moses is a 20 y.o. female currently pregnant presents to the emergency department with sore throat and headache 1 day. Patient denies any fever afebrile on presentation temperature 98.6. Patient denies any previous history of strep throat. Patient describes current sore throat is mild     Past Medical History  Diagnosis Date  . Amenorrhea   . Hx of chlamydia infection     Patient Active Problem List   Diagnosis Date Noted  . 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 Rubella non-immune status, antepartum 07/26/2015  . History of RPR test 07/26/2015    History reviewed. No pertinent past surgical history.  Current Outpatient Rx  Name  Route  Sig  Dispense  Refill  . amoxicillin (AMOXIL) 500 MG capsule   Oral   Take 1 capsule (500 mg total) by mouth 3 (three) times daily. Patient not taking: Reported on 08/28/2015   30 capsule   0   . brompheniramine-pseudoephedrine-DM 30-2-10 MG/5ML syrup   Oral   Take 5 mLs by mouth 4 (four) times daily as needed. Mixed with 5 mL of viscous lidocaine for swish and small Patient not taking: Reported on 08/28/2015   120 mL   0   . lidocaine (XYLOCAINE) 2 % solution   Mouth/Throat   Use as directed 5 mLs in the mouth or throat every 6 (six) hours as needed for mouth pain. Mixed with 5 mL of Bromfed DM for swish and swallow. Patient not taking: Reported on 08/28/2015   100 mL   0   . PRENATAL 28-0.8 MG TABS   Oral   Take by mouth.           Allergies No known drug allergies  Family History  Problem Relation Age of Onset  . Breast cancer Maternal Grandmother     Social History Social History  Substance Use Topics  . Smoking status: Never Smoker   . Smokeless  tobacco: Never Used  . Alcohol Use: No    Review of Systems  Constitutional: Negative for fever. Eyes: Negative for visual changes. ENT: Positive for sore throat. Cardiovascular: Negative for chest pain. Respiratory: Negative for shortness of breath. Gastrointestinal: Negative for abdominal pain, vomiting and diarrhea. Genitourinary: Negative for dysuria. Musculoskeletal: Negative for back pain. Skin: Negative for rash. Neurological: Negative for headaches, focal weakness or numbness.    10-point ROS otherwise negative.  ____________________________________________   PHYSICAL EXAM:  VITAL SIGNS: ED Triage Vitals  Enc Vitals Group     BP 09/13/15 2345 130/59 mmHg     Pulse Rate 09/13/15 2345 93     Resp 09/13/15 2345 20     Temp 09/13/15 2345 98.6 F (37 C)     Temp Source 09/13/15 2345 Oral     SpO2 09/13/15 2345 100 %     Weight 09/13/15 2345 125 lb (56.7 kg)     Height 09/13/15 2345  (1.549 m)     Head Cir --      Peak Flow --      Pain Score 09/13/15 2346 7     Pain Loc --      Pain Edu? --      Excl. in GC? --      Constitutional: Alert and oriented. Well appearing  and in no distress. Eyes: Conjunctivae are normal. PERRL. Normal extraocular movements. ENT   Head: Normocephalic and atraumatic.   Nose: No congestion/rhinnorhea.   Mouth/Throat: Mucous membranes are moist.Pharyngeal erythema no exudate noted   Neck: No stridor. Hematological/Lymphatic/Immunilogical: No cervical lymphadenopathy. Cardiovascular: Normal rate, regular rhythm. Normal and symmetric distal pulses are present in all extremities. No murmurs, rubs, or gallops. Respiratory: Normal respiratory effort without tachypnea nor retractions. Breath sounds are clear and equal bilaterally. No wheezes/rales/rhonchi. Gastrointestinal: Soft and nontender. No distention. There is no CVA tenderness. Genitourinary: deferred Musculoskeletal: Nontender with normal range of motion in all  extremities. No joint effusions.  No lower extremity tenderness nor edema. Neurologic:  Normal speech and language. No gross focal neurologic deficits are appreciated. Speech is normal.  Skin:  Skin is warm, dry and intact. No rash noted. Psychiatric: Mood and affect are normal. Speech and behavior are normal. Patient exhibits appropriate insight and judgment.  ____________________________________________    LABS (pertinent positives/negatives)  Labs Reviewed  MONONUCLEOSIS SCREEN - Abnormal; Notable for the following:    Mono Screen POSITIVE (*)    All other components within normal limits  CULTURE, GROUP A STREP [facility])  POCT RAPID STREP A     ________________________   INITIAL IMPRESSION / ASSESSMENT AND PLAN / ED COURSE  Pertinent labs & imaging results that were available during my care of the patient were reviewed by me and considered in my medical decision making (see chart for details).  Patient advised to follow-up with OB/GYN and advised them of diagnosis of mono.  ____________________________________________   FINAL CLINICAL IMPRESSION(S) / ED DIAGNOSES  Final diagnoses:  Acute pharyngitis, unspecified pharyngitis type  Mononucleosis      Darci Currentandolph N Aribelle Mccosh, MD 09/19/15 [phone]

## 2015-09-14 NOTE — Discharge Instructions (Signed)
Pharyngitis Pharyngitis is redness, pain, and swelling (inflammation) of your pharynx.  CAUSES  Pharyngitis is usually caused by infection. Most of the time, these infections are from viruses (viral) and are part of a cold. However, sometimes pharyngitis is caused by bacteria (bacterial). Pharyngitis can also be caused by allergies. Viral pharyngitis may be spread from person to person by coughing, sneezing, and personal items or utensils (cups, forks, spoons, toothbrushes). Bacterial pharyngitis may be spread from person to person by more intimate contact, such as kissing.  SIGNS AND SYMPTOMS  Symptoms of pharyngitis include:   Sore throat.   Tiredness (fatigue).   Low-grade fever.   Headache.  Joint pain and muscle aches.  Skin rashes.  Swollen lymph nodes.  Plaque-like film on throat or tonsils (often seen with bacterial pharyngitis). DIAGNOSIS  Your health care provider will ask you questions about your illness and your symptoms. Your medical history, along with a physical exam, is often all that is needed to diagnose pharyngitis. Sometimes, a rapid strep test is done. Other lab tests may also be done, depending on the suspected cause.  TREATMENT  Viral pharyngitis will usually get better in 3-4 days without the use of medicine. Bacterial pharyngitis is treated with medicines that kill germs (antibiotics).  HOME CARE INSTRUCTIONS   Drink enough water and fluids to keep your urine clear or pale yellow.   Only take over-the-counter or prescription medicines as directed by your health care provider:   If you are prescribed antibiotics, make sure you finish them even if you start to feel better.   Do not take aspirin.   Get lots of rest.   Gargle with 8 oz of salt water ( tsp of salt per 1 qt of water) as often as every 1-2 hours to soothe your throat.   Throat lozenges (if you are not at risk for choking) or sprays may be used to soothe your throat. SEEK MEDICAL  CARE IF:   You have large, tender lumps in your neck.  You have a rash.  You cough up green, yellow-Lamees Gable, or bloody spit. SEEK IMMEDIATE MEDICAL CARE IF:   Your neck becomes stiff.  You drool or are unable to swallow liquids.  You vomit or are unable to keep medicines or liquids down.  You have severe pain that does not go away with the use of recommended medicines.  You have trouble breathing (not caused by a stuffy nose). MAKE SURE YOU:   Understand these instructions.  Will watch your condition.  Will get help right away if you are not doing well or get worse.   This information is not intended to replace advice given to you by your health care provider. Make sure you discuss any questions you have with your health care provider.   Document Released: 04/14/2005 Document Revised: 02/02/2013 Document Reviewed: 12/20/2012 Elsevier Interactive Patient Education 2016 [employer]  Infectious Mononucleosis Infectious mononucleosis is an infection caused by a virus. This illness is often called "mono." It causes symptoms that affect various areas of the body, including the throat, upper air passages, and lymph glands. The liver or spleen may also be affected. The virus spreads from person to person through close contact. The illness is usually not serious and often goes away in 2-4 weeks without treatment. In rare cases, symptoms can be more severe and last longer, sometimes up to several months. Because the illness can sometimes cause the liver or spleen to become enlarged, you should not participate in contact  sports or strenuous exercise until your health care provider approves. CAUSES  Infectious mononucleosis is caused by the Epstein-Barr virus. This virus spreads through contact with an infected person's saliva or other bodily fluids. It is often spread through kissing. It may also spread through coughing or sharing utensils or drinking glasses that were recently used by an  infected person. An infected person will not always appear ill but can still spread the virus. RISK FACTORS This illness is most common in adolescents and young adults. SIGNS AND SYMPTOMS  The most common symptoms of infectious mononucleosis are:  Sore throat.   Headache.   Fatigue.   Muscle aches.   Swollen glands.   Fever.   Poor appetite.   Enlarged liver or spleen.  Some less common symptoms that can also occur include:  Rash. This is more common if you take antibiotic medicines.  Feeling sick to your stomach (nauseous).   Abdominal pain.  DIAGNOSIS  Your health care provider will take your medical history and do a physical exam. Blood tests can be done to confirm the diagnosis.  TREATMENT  Infectious mononucleosis usually goes away on its own with time. It cannot be cured with medicines, but medicines are sometimes used to relieve symptoms. Steroid medicine is sometimes needed if the swelling in the throat causes breathing or swallowing problems. Treatment in a hospital is sometimes needed for severe cases.  HOME CARE INSTRUCTIONS   Rest as needed.   Do not participate in contact sports, strenuous exercise, or heavy lifting until your health care provider approves. The liver and spleen could be seriously injured if they are enlarged from the illness. You may need to wait a couple months before participating in sports.   Drink enough fluid to keep your urine clear or pale yellow.   Do not drink alcohol.  Take medicines only as directed by your health care provider. Children under 22 years of age should not take aspirin because of the association with Reye syndrome.   Eat soft foods. Cold foods such as ice cream or frozen ice pops can soothe a sore throat.  If you have a sore throat, gargle with a mixture of salt and water. This may help relieve your discomfort. Mix 1 tsp of salt in 1 cup of warm water. Sucking on hard candy may also help.   Start  regular activities gradually after the fever is gone. Be sure to rest when tired.   Avoid kissing or sharing utensils or drinking glasses until your health care provider tells you that you are no longer contagious.  PREVENTION  To avoid spreading the virus, do not kiss anyone or share utensils, drinking glasses, or food until your health care provider tells you that you are no longer contagious. SEEK MEDICAL CARE IF:   Your fever is not gone after 10 days.  You have swollen lymph nodes that are not back to normal after 4 weeks.  Your activity level is not back to normal after 2 months.   You have yellow coloring to your eyes and skin (jaundice).  You have constipation.  SEEK IMMEDIATE MEDICAL CARE IF:   You have severe pain in the abdomen or shoulder.  You are drooling.  You have trouble swallowing.  You have trouble breathing.  You develop a stiff neck.  You develop a severe headache.  You cannot stop throwing up (vomiting).  You have convulsions.  You are confused.  You have trouble with balance.  You have signs of  dehydration. These may include:  Weakness.  Sunken eyes.  Pale skin.  Dry mouth.  Rapid breathing or pulse.   This information is not intended to replace advice given to you by your health care provider. Make sure you discuss any questions you have with your health care provider.   Document Released: 04/11/2000 Document Revised: 05/05/2014 Document Reviewed: 12/20/2013 Elsevier Interactive Patient Education [employer]

## 2015-09-14 NOTE — Telephone Encounter (Signed)
Reviewed ED labs, called patient and informed that there is no antibiotic to treat mono as it is a virus, just has to run its corse; to push fluid, tylenol as needed, and rest.

## 2015-09-14 NOTE — ED Notes (Signed)

## 2015-09-16 LAB — CULTURE, GROUP A STREP (THRC)

## 2015-09-26 ENCOUNTER — Encounter: Payer: Self-pay | Admitting: Obstetrics and Gynecology

## 2015-09-26 ENCOUNTER — Ambulatory Visit (INDEPENDENT_AMBULATORY_CARE_PROVIDER_SITE_OTHER): Payer: Medicaid Other | Admitting: Obstetrics and Gynecology

## 2015-09-26 VITALS — BP 127/63 | HR 90 | Wt 131.6 lb

## 2015-09-26 DIAGNOSIS — Z3492 Encounter for supervision of normal pregnancy, unspecified, second trimester: Secondary | ICD-10-CM

## 2015-09-26 LAB — POCT URINALYSIS DIPSTICK
Bilirubin, UA: NEGATIVE
Glucose, UA: NEGATIVE
KETONES UA: NEGATIVE
Nitrite, UA: NEGATIVE
PH UA: 8
RBC UA: NEGATIVE
Spec Grav, UA: 1.01
Urobilinogen, UA: 0.2

## 2015-09-26 NOTE — Progress Notes (Signed)
ROB- pt denies any new complaints 

## 2015-09-26 NOTE — Progress Notes (Signed)
Rob- DOING WELL, ANATOMY SCAN NEXT VISIT

## 2015-10-08 ENCOUNTER — Telehealth: Payer: Self-pay | Admitting: Obstetrics and Gynecology

## 2015-10-08 NOTE — Telephone Encounter (Signed)
Pt informed that I could not write her an excuse to be out of work. She will need to see a provider for this. She is speaking of normal pregnancy issues/concerns. Did give her a note for lifting and prolonged standing instructions. Pt to pick up after 3pm today.

## 2015-10-08 NOTE — Telephone Encounter (Signed)
Pt called to see if she could get a letter writing her out of work. 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 Pt states that it's getting more and more difficult to do so. Please advise.

## 2015-10-30 ENCOUNTER — Other Ambulatory Visit: Payer: Medicaid Other

## 2015-10-31 ENCOUNTER — Encounter: Payer: Medicaid Other | Admitting: Obstetrics and Gynecology

## 2015-11-02 ENCOUNTER — Encounter: Payer: Medicaid Other | Admitting: Obstetrics and Gynecology

## 2015-11-06 ENCOUNTER — Ambulatory Visit (INDEPENDENT_AMBULATORY_CARE_PROVIDER_SITE_OTHER): Payer: Medicaid Other

## 2015-11-06 DIAGNOSIS — Z3492 Encounter for supervision of normal pregnancy, unspecified, second trimester: Secondary | ICD-10-CM

## 2015-11-14 ENCOUNTER — Encounter: Payer: Self-pay | Admitting: Obstetrics and Gynecology

## 2015-11-14 ENCOUNTER — Ambulatory Visit (INDEPENDENT_AMBULATORY_CARE_PROVIDER_SITE_OTHER): Payer: Medicaid Other | Admitting: Obstetrics and Gynecology

## 2015-11-14 VITALS — BP 128/70 | HR 94 | Temp 98.5°F | Wt 134.0 lb

## 2015-11-14 DIAGNOSIS — Z9289 Personal history of other medical treatment: Secondary | ICD-10-CM

## 2015-11-14 DIAGNOSIS — Z3492 Encounter for supervision of normal pregnancy, unspecified, second trimester: Secondary | ICD-10-CM

## 2015-11-14 DIAGNOSIS — Z3490 Encounter for supervision of normal pregnancy, unspecified, unspecified trimester: Secondary | ICD-10-CM | POA: Insufficient documentation

## 2015-11-14 LAB — POCT URINALYSIS DIPSTICK
BILIRUBIN UA: NEGATIVE
Ketones, UA: NEGATIVE
Nitrite, UA: NEGATIVE
PH UA: 6
RBC UA: NEGATIVE
SPEC GRAV UA: 1.015
Urobilinogen, UA: 1

## 2015-11-14 NOTE — Progress Notes (Signed)
Prenatal Visit Note Date: 11/14/2015 Clinic: [facility] (transfer of care visit from [facility])  Subjective:  Tracy Moses is a 20 y.o. G2P1001 at 2743w4d being seen today for ongoing prenatal care.  She is currently monitored for the following issues for this low-risk pregnancy and has History of RPR test; Marijuana use; and Supervision of normal pregnancy in second trimester on her problem list.  Patient reports no complaints.   Contractions: Not present. Vag. Bleeding: None.  Movement: Present. Denies leaking of fluid.   The following portions of the patient's history were reviewed and updated as appropriate: allergies, current medications, past family history, past medical history, past social history, past surgical history and problem list. Problem list updated.  Objective:   Filed Vitals:   11/14/15 1558  BP: 128/70  Pulse: 94  Temp: 98.5 F (36.9 C)  Weight: 134 lb (60.782 kg)    Fetal Status:     Movement: Present     General:  Alert, oriented and cooperative. Patient is in no acute distress.  Skin: Skin is warm and dry. No rash noted.   Cardiovascular: Normal heart rate noted  Respiratory: Normal respiratory effort, no problems with respiration noted  Abdomen: Soft, gravid, appropriate for gestational age. Pain/Pressure: Absent     Pelvic:  Cervical exam deferred        Extremities: Normal range of motion.  Edema: None  Mental Status: Normal mood and affect. Normal behavior. Normal judgment and thought content.   Urinalysis:      Assessment and Plan:  Pregnancy: G2P1001 at 5043w4d  1. Prenatal care, second trimester Routine care. EPIC chart reviewed. Low risk pregnancy and last one was low risk too. F/u RPR titer nv. Negative TPA testing. Was +RPR/negative TPA last pregnancy too. glucola nv. D/w pt re: practice style, etc.   Preterm labor symptoms and general obstetric precautions including but not limited to vaginal bleeding, contractions, leaking of fluid and fetal  movement were reviewed in detail with the patient. Please refer to After Visit Summary for other counseling recommendations.  Return in about 4 weeks (around 12/12/2015).   [place] Bingharlie Shandra Szymborski, MD

## 2015-11-15 ENCOUNTER — Encounter: Payer: Medicaid Other | Admitting: Obstetrics and Gynecology

## 2015-12-12 ENCOUNTER — Ambulatory Visit (INDEPENDENT_AMBULATORY_CARE_PROVIDER_SITE_OTHER): Payer: Medicaid Other | Admitting: Obstetrics and Gynecology

## 2015-12-12 VITALS — BP 122/72 | HR 101 | Temp 98.9°F | Wt 141.5 lb

## 2015-12-12 DIAGNOSIS — F1290 Cannabis use, unspecified, uncomplicated: Secondary | ICD-10-CM

## 2015-12-12 DIAGNOSIS — Z3492 Encounter for supervision of normal pregnancy, unspecified, second trimester: Secondary | ICD-10-CM

## 2015-12-12 DIAGNOSIS — Z9289 Personal history of other medical treatment: Secondary | ICD-10-CM

## 2015-12-12 LAB — POCT URINALYSIS DIPSTICK
Bilirubin, UA: NEGATIVE
Blood, UA: NEGATIVE
Glucose, UA: NEGATIVE
Ketones, UA: NEGATIVE
Nitrite, UA: NEGATIVE
Spec Grav, UA: 1.015
Urobilinogen, UA: 0.2
pH, UA: 7

## 2015-12-12 NOTE — Progress Notes (Signed)
Subjective:  Tracy Moses is a 20 y.o. G2P1001 at 3547w4d being seen today for ongoing prenatal care.  She is currently monitored for the following issues for this low-risk pregnancy and has History of RPR test; Marijuana use; and Supervision of normal pregnancy in second trimester on her problem list.  Patient reports no complaints.  Contractions: Not present. Vag. Bleeding: None.  Movement: Present. Denies leaking of fluid.   The following portions of the patient's history were reviewed and updated as appropriate: allergies, current medications, past family history, past medical history, past social history, past surgical history and problem list. Problem list updated.  Objective:   Vitals:   12/12/15 1541  BP: 122/72  Pulse: (!) 101  Temp: 98.9 F (37.2 C)  Weight: 141 lb 8 oz (64.2 kg)    Fetal Status: Fetal Heart Rate (bpm): 148 Fundal Height: 27 cm Movement: Present     General:  Alert, oriented and cooperative. Patient is in no acute distress.  Skin: Skin is warm and dry. No rash noted.   Cardiovascular: Normal heart rate noted  Respiratory: Normal respiratory effort, no problems with respiration noted  Abdomen: Soft, gravid, appropriate for gestational age. Pain/Pressure: Absent     Pelvic:  Cervical exam deferred        Extremities: Normal range of motion.  Edema: None  Mental Status: Normal mood and affect. Normal behavior. Normal judgment and thought content.   Urinalysis: Urine Protein: Trace Urine Glucose: Negative  Assessment and Plan:  Pregnancy: G2P1001 at 2047w4d  1. Prenatal care, second trimester  - POCT Urinalysis Dipstick  2. Supervision of normal pregnancy in second trimester Patient is doing well. She is unable to return for 2 hr glucola. Will perform 1 hr glucola today - RPR - CBC - HIV antibody - Glucose, 1 hour gestational  3. History of RPR test   4. Marijuana use   Preterm labor symptoms and general obstetric precautions including but  not limited to vaginal bleeding, contractions, leaking of fluid and fetal movement were reviewed in detail with the patient. Please refer to After Visit Summary for other counseling recommendations.  No Follow-up on file.   Catalina AntiguaPeggy Dryden Tapley, MD

## 2015-12-12 NOTE — Patient Instructions (Signed)
Breastfeeding Deciding to breastfeed is one of the best choices you can make for you and your baby. A change in hormones during pregnancy causes your breast tissue to grow and increases the number and size of your milk ducts. These hormones also allow proteins, sugars, and fats from your blood supply to make breast milk in your milk-producing glands. Hormones prevent breast milk from being released before your baby is born as well as prompt milk flow after birth. Once breastfeeding has begun, thoughts of your baby, as well as his or her sucking or crying, can stimulate the release of milk from your milk-producing glands.  BENEFITS OF BREASTFEEDING For Your Baby  Your first milk (colostrum) helps your baby's digestive system function better.  There are antibodies in your milk that help your baby fight off infections.  Your baby has a lower incidence of asthma, allergies, and sudden infant death syndrome.  The nutrients in breast milk are better for your baby than infant formulas and are designed uniquely for your baby's needs.  Breast milk improves your baby's brain development.  Your baby is less likely to develop other conditions, such as childhood obesity, asthma, or type 2 diabetes mellitus. For You  Breastfeeding helps to create a very special bond between you and your baby.  Breastfeeding is convenient. Breast milk is always available at the correct temperature and costs nothing.  Breastfeeding helps to burn calories and helps you lose the weight gained during pregnancy.  Breastfeeding makes your uterus contract to its prepregnancy size faster and slows bleeding (lochia) after you give birth.   Breastfeeding helps to lower your risk of developing type 2 diabetes mellitus, osteoporosis, and breast or ovarian cancer later in life. SIGNS THAT YOUR BABY IS HUNGRY Early Signs of Hunger  Increased alertness or activity.  Stretching.  Movement of the head from side to  side.  Movement of the head and opening of the mouth when the corner of the mouth or cheek is stroked (rooting).  Increased sucking sounds, smacking lips, cooing, sighing, or squeaking.  Hand-to-mouth movements.  Increased sucking of fingers or hands. Late Signs of Hunger  Fussing.  Intermittent crying. Extreme Signs of Hunger Signs of extreme hunger will require calming and consoling before your baby will be able to breastfeed successfully. Do not wait for the following signs of extreme hunger to occur before you initiate breastfeeding:  Restlessness.  A loud, strong cry.  Screaming. BREASTFEEDING BASICS Breastfeeding Initiation  Find a comfortable place to sit or lie down, with your neck and back well supported.  Place a pillow or rolled up blanket under your baby to bring him or her to the level of your breast (if you are seated). Nursing pillows are specially designed to help support your arms and your baby while you breastfeed.  Make sure that your baby's abdomen is facing your abdomen.  Gently massage your breast. With your fingertips, massage from your chest wall toward your nipple in a circular motion. This encourages milk flow. You may need to continue this action during the feeding if your milk flows slowly.  Support your breast with 4 fingers underneath and your thumb above your nipple. Make sure your fingers are well away from your nipple and your baby's mouth.  Stroke your baby's lips gently with your finger or nipple.  When your baby's mouth is open wide enough, quickly bring your baby to your breast, placing your entire nipple and as much of the colored area around your nipple (  areola) as possible into your baby's mouth.  More areola should be visible above your baby's upper lip than below the lower lip.  Your baby's tongue should be between his or her lower gum and your breast.  Ensure that your baby's mouth is correctly positioned around your nipple  (latched). Your baby's lips should create a seal on your breast and be turned out (everted).  It is common for your baby to suck about 2-3 minutes in order to start the flow of breast milk. Latching Teaching your baby how to latch on to your breast properly is very important. An improper latch can cause nipple pain and decreased milk supply for you and poor weight gain in your baby. Also, if your baby is not latched onto your nipple properly, he or she may swallow some air during feeding. This can make your baby fussy. Burping your baby when you switch breasts during the feeding can help to get rid of the air. However, teaching your baby to latch on properly is still the best way to prevent fussiness from swallowing air while breastfeeding. Signs that your baby has successfully latched on to your nipple:  Silent tugging or silent sucking, without causing you pain.  Swallowing heard between every 3-4 sucks.  Muscle movement above and in front of his or her ears while sucking. Signs that your baby has not successfully latched on to nipple:  Sucking sounds or smacking sounds from your baby while breastfeeding.  Nipple pain. If you think your baby has not latched on correctly, slip your finger into the corner of your baby's mouth to break the suction and place it between your baby's gums. Attempt breastfeeding initiation again. Signs of Successful Breastfeeding Signs from your baby:  A gradual decrease in the number of sucks or complete cessation of sucking.  Falling asleep.  Relaxation of his or her body.  Retention of a small amount of milk in his or her mouth.  Letting go of your breast by himself or herself. Signs from you:  Breasts that have increased in firmness, weight, and size 1-3 hours after feeding.  Breasts that are softer immediately after breastfeeding.  Increased milk volume, as well as a change in milk consistency and color by the fifth day of breastfeeding.  Nipples  that are not sore, cracked, or bleeding. Signs That Your Baby is Getting Enough Milk  Wetting at least 3 diapers in a 24-hour period. The urine should be clear and pale yellow by age 5 days.  At least 3 stools in a 24-hour period by age 5 days. The stool should be soft and yellow.  At least 3 stools in a 24-hour period by age 7 days. The stool should be seedy and yellow.  No loss of weight greater than 10% of birth weight during the first 3 days of age.  Average weight gain of 4-7 ounces (113-198 g) per week after age 4 days.  Consistent daily weight gain by age 5 days, without weight loss after the age of 2 weeks. After a feeding, your baby may spit up a small amount. This is common. BREASTFEEDING FREQUENCY AND DURATION Frequent feeding will help you make more milk and can prevent sore nipples and breast engorgement. Breastfeed when you feel the need to reduce the fullness of your breasts or when your baby shows signs of hunger. This is called "breastfeeding on demand." Avoid introducing a pacifier to your baby while you are working to establish breastfeeding (the first 4-6 weeks   after your baby is born). After this time you may choose to use a pacifier. Research has shown that pacifier use during the first year of a baby's life decreases the risk of sudden infant death syndrome (SIDS). Allow your baby to feed on each breast as long as he or she wants. Breastfeed until your baby is finished feeding. When your baby unlatches or falls asleep while feeding from the first breast, offer the second breast. Because newborns are often sleepy in the first few weeks of life, you may need to awaken your baby to get him or her to feed. Breastfeeding times will vary from baby to baby. However, the following rules can serve as a guide to help you ensure that your baby is properly fed:  Newborns (babies 4 weeks of age or younger) may breastfeed every 1-3 hours.  Newborns should not go longer than 3 hours  during the day or 5 hours during the night without breastfeeding.  You should breastfeed your baby a minimum of 8 times in a 24-hour period until you begin to introduce solid foods to your baby at around 6 months of age. BREAST MILK PUMPING Pumping and storing breast milk allows you to ensure that your baby is exclusively fed your breast milk, even at times when you are unable to breastfeed. This is especially important if you are going back to work while you are still breastfeeding or when you are not able to be present during feedings. Your lactation consultant can give you guidelines on how long it is safe to store breast milk. A breast pump is a machine that allows you to pump milk from your breast into a sterile bottle. The pumped breast milk can then be stored in a refrigerator or freezer. Some breast pumps are operated by hand, while others use electricity. Ask your lactation consultant which type will work best for you. Breast pumps can be purchased, but some hospitals and breastfeeding support groups lease breast pumps on a monthly basis. A lactation consultant can teach you how to hand express breast milk, if you prefer not to use a pump. CARING FOR YOUR BREASTS WHILE YOU BREASTFEED Nipples can become dry, cracked, and sore while breastfeeding. The following recommendations can help keep your breasts moisturized and healthy:  Avoid using soap on your nipples.  Wear a supportive bra. Although not required, special nursing bras and tank tops are designed to allow access to your breasts for breastfeeding without taking off your entire bra or top. Avoid wearing underwire-style bras or extremely tight bras.  Air dry your nipples for 3-4minutes after each feeding.  Use only cotton bra pads to absorb leaked breast milk. Leaking of breast milk between feedings is normal.  Use lanolin on your nipples after breastfeeding. Lanolin helps to maintain your skin's normal moisture barrier. If you use  pure lanolin, you do not need to wash it off before feeding your baby again. Pure lanolin is not toxic to your baby. You may also hand express a few drops of breast milk and gently massage that milk into your nipples and allow the milk to air dry. In the first few weeks after giving birth, some women experience extremely full breasts (engorgement). Engorgement can make your breasts feel heavy, warm, and tender to the touch. Engorgement peaks within 3-5 days after you give birth. The following recommendations can help ease engorgement:  Completely empty your breasts while breastfeeding or pumping. You may want to start by applying warm, moist heat (in   the shower or with warm water-soaked hand towels) just before feeding or pumping. This increases circulation and helps the milk flow. If your baby does not completely empty your breasts while breastfeeding, pump any extra milk after he or she is finished.  Wear a snug bra (nursing or regular) or tank top for 1-2 days to signal your body to slightly decrease milk production.  Apply ice packs to your breasts, unless this is too uncomfortable for you.  Make sure that your baby is latched on and positioned properly while breastfeeding. If engorgement persists after 48 hours of following these recommendations, contact your health care provider or a lactation consultant. OVERALL HEALTH CARE RECOMMENDATIONS WHILE BREASTFEEDING  Eat healthy foods. Alternate between meals and snacks, eating 3 of each per day. Because what you eat affects your breast milk, some of the foods may make your baby more irritable than usual. Avoid eating these foods if you are sure that they are negatively affecting your baby.  Drink milk, fruit juice, and water to satisfy your thirst (about 10 glasses a day).  Rest often, relax, and continue to take your prenatal vitamins to prevent fatigue, stress, and anemia.  Continue breast self-awareness checks.  Avoid chewing and smoking  tobacco. Chemicals from cigarettes that pass into breast milk and exposure to secondhand smoke may harm your baby.  Avoid alcohol and drug use, including marijuana. Some medicines that may be harmful to your baby can pass through breast milk. It is important to ask your health care provider before taking any medicine, including all over-the-counter and prescription medicine as well as vitamin and herbal supplements. It is possible to become pregnant while breastfeeding. If birth control is desired, ask your health care provider about options that will be safe for your baby. SEEK MEDICAL CARE IF:  You feel like you want to stop breastfeeding or have become frustrated with breastfeeding.  You have painful breasts or nipples.  Your nipples are cracked or bleeding.  Your breasts are red, tender, or warm.  You have a swollen area on either breast.  You have a fever or chills.  You have nausea or vomiting.  You have drainage other than breast milk from your nipples.  Your breasts do not become full before feedings by the fifth day after you give birth.  You feel sad and depressed.  Your baby is too sleepy to eat well.  Your baby is having trouble sleeping.   Your baby is wetting less than 3 diapers in a 24-hour period.  Your baby has less than 3 stools in a 24-hour period.  Your baby's skin or the white part of his or her eyes becomes yellow.   Your baby is not gaining weight by 5 days of age. SEEK IMMEDIATE MEDICAL CARE IF:  Your baby is overly tired (lethargic) and does not want to wake up and feed.  Your baby develops an unexplained fever.   This information is not intended to replace advice given to you by your health care provider. Make sure you discuss any questions you have with your health care provider.   Document Released: 04/14/2005 Document Revised: 01/03/2015 Document Reviewed: 10/06/2012 Elsevier Interactive Patient Education 2016 [employer]  

## 2015-12-12 NOTE — Progress Notes (Signed)
Pt denies concerns at this time. 

## 2015-12-13 LAB — CBC
HEMATOCRIT: 29.7 % — AB (ref 34.0–46.6)
HEMOGLOBIN: 9.5 g/dL — AB (ref 11.1–15.9)
MCH: 22.8 pg — ABNORMAL LOW (ref 26.6–33.0)
MCHC: 32 g/dL (ref 31.5–35.7)
MCV: 71 fL — AB (ref 79–97)
Platelets: 265 10*3/uL (ref 150–379)
RBC: 4.17 x10E6/uL (ref 3.77–5.28)
RDW: 15.1 % (ref 12.3–15.4)
WBC: 10.9 10*3/uL — ABNORMAL HIGH (ref 3.4–10.8)

## 2015-12-13 LAB — RPR: RPR: NONREACTIVE

## 2015-12-13 LAB — GLUCOSE, 1 HOUR GESTATIONAL: GESTATIONAL DIABETES SCREEN: 85 mg/dL (ref 65–139)

## 2015-12-13 LAB — HIV ANTIBODY (ROUTINE TESTING W REFLEX): HIV Screen 4th Generation wRfx: NONREACTIVE

## 2015-12-21 ENCOUNTER — Telehealth: Payer: Self-pay | Admitting: Obstetrics and Gynecology

## 2015-12-21 NOTE — Telephone Encounter (Signed)
Pt called requesting a letter for work stating that she needs to work 40 hrs week and not the 10 hrs a day for 5 days. Pt needs letter stating that she needs to reduce the hrs a day that she's working. She did ask if the letter could be emailed to her, but she was advised that once she activates a MyChart account, that she can access the letter that way. Please advise.

## 2015-12-24 NOTE — Telephone Encounter (Signed)
LM on VM to CB re: letter details

## 2016-01-01 ENCOUNTER — Encounter: Payer: Self-pay | Admitting: *Deleted

## 2016-01-01 ENCOUNTER — Ambulatory Visit (INDEPENDENT_AMBULATORY_CARE_PROVIDER_SITE_OTHER): Payer: Medicaid Other | Admitting: Obstetrics and Gynecology

## 2016-01-01 VITALS — BP 128/76 | HR 106 | Wt 152.0 lb

## 2016-01-01 DIAGNOSIS — Z3492 Encounter for supervision of normal pregnancy, unspecified, second trimester: Secondary | ICD-10-CM

## 2016-01-01 DIAGNOSIS — Z3482 Encounter for supervision of other normal pregnancy, second trimester: Secondary | ICD-10-CM

## 2016-01-01 NOTE — Progress Notes (Signed)
Subjective:  Tracy Moses is a 20 y.o. G2P1001 at 5158w3d being seen today for ongoing prenatal care.  She is currently monitored for the following issues for this low-risk pregnancy and has History of RPR test; Marijuana use; and Supervision of normal pregnancy in second trimester on her problem list.  Patient reports no complaints.  Contractions: Not present. Vag. Bleeding: None.  Movement: Present. Denies leaking of fluid.   The following portions of the patient's history were reviewed and updated as appropriate: allergies, current medications, past family history, past medical history, past social history, past surgical history and problem list. Problem list updated.  Objective:   Vitals:   01/01/16 1550  BP: 128/76  Pulse: (!) 106  Weight: 152 lb (68.9 kg)    Fetal Status: Fetal Heart Rate (bpm): 157   Movement: Present     General:  Alert, oriented and cooperative. Patient is in no acute distress.  Skin: Skin is warm and dry. No rash noted.   Cardiovascular: Normal heart rate noted  Respiratory: Normal respiratory effort, no problems with respiration noted  Abdomen: Soft, gravid, appropriate for gestational age. Pain/Pressure: Absent     Pelvic:  Cervical exam deferred        Extremities: Normal range of motion.  Edema: None  Mental Status: Normal mood and affect. Normal behavior. Normal judgment and thought content.   Urinalysis: Urine Protein: Trace Urine Glucose: Negative  Assessment and Plan:  Pregnancy: G2P1001 at 1958w3d  1. Supervision of normal pregnancy in second trimester   Preterm labor symptoms and general obstetric precautions including but not limited to vaginal bleeding, contractions, leaking of fluid and fetal movement were reviewed in detail with the patient. Please refer to After Visit Summary for other counseling recommendations.  Return in about 2 weeks (around 01/15/2016) for OB visit.   Hermina StaggersMichael L Maanav Kassabian, MD

## 2016-01-07 NOTE — Telephone Encounter (Signed)
Letter written 9/6

## 2016-01-15 ENCOUNTER — Ambulatory Visit (INDEPENDENT_AMBULATORY_CARE_PROVIDER_SITE_OTHER): Payer: Medicaid Other | Admitting: Obstetrics and Gynecology

## 2016-01-15 DIAGNOSIS — Z3482 Encounter for supervision of other normal pregnancy, second trimester: Secondary | ICD-10-CM

## 2016-01-15 DIAGNOSIS — Z3492 Encounter for supervision of normal pregnancy, unspecified, second trimester: Secondary | ICD-10-CM

## 2016-01-15 NOTE — Progress Notes (Signed)
Patient states that she is overall feeling well and started feeling irregular contractions 2 weeks ago, but it does not occur daily. Patient states no pain/pressure and no vaginal bleeding.

## 2016-01-15 NOTE — Progress Notes (Signed)
Subjective:  Tracy Moses is a 20 y.o. G2P1001 at 4035w3d being seen today for ongoing prenatal care.  She is currently monitored for the following issues for this low-risk pregnancy and has History of RPR test; Marijuana use; and Supervision of normal pregnancy in second trimester on her problem list.  Patient reports no complaints.  Contractions: Irregular. Vag. Bleeding: None.  Movement: Present. Denies leaking of fluid.   The following portions of the patient's history were reviewed and updated as appropriate: allergies, current medications, past family history, past medical history, past social history, past surgical history and problem list. Problem list updated.  Objective:   Vitals:   01/15/16 0953  BP: 110/67  Pulse: 94  Temp: 98.2 F (36.8 C)  Weight: 154 lb 6.4 oz (70 kg)    Fetal Status:     Movement: Present     General:  Alert, oriented and cooperative. Patient is in no acute distress.  Skin: Skin is warm and dry. No rash noted.   Cardiovascular: Normal heart rate noted  Respiratory: Normal respiratory effort, no problems with respiration noted  Abdomen: Soft, gravid, appropriate for gestational age. Pain/Pressure: Absent     Pelvic:  Cervical exam deferred        Extremities: Normal range of motion.  Edema: None  Mental Status: Normal mood and affect. Normal behavior. Normal judgment and thought content.   Urinalysis: Urine Protein: Negative Urine Glucose: Negative  Assessment and Plan:  Pregnancy: G2P1001 at 2835w3d  1. Supervision of normal pregnancy in second trimester   Preterm labor symptoms and general obstetric precautions including but not limited to vaginal bleeding, contractions, leaking of fluid and fetal movement were reviewed in detail with the patient. Please refer to After Visit Summary for other counseling recommendations.  Return in about 2 weeks (around 01/29/2016) for OB visit.   Hermina StaggersMichael L Suprena Travaglini, MD

## 2016-01-29 ENCOUNTER — Ambulatory Visit: Payer: BLUE CROSS/BLUE SHIELD | Admitting: Certified Nurse Midwife

## 2016-01-29 DIAGNOSIS — Z3492 Encounter for supervision of normal pregnancy, unspecified, second trimester: Secondary | ICD-10-CM

## 2016-01-29 DIAGNOSIS — Z3482 Encounter for supervision of other normal pregnancy, second trimester: Secondary | ICD-10-CM | POA: Diagnosis not present

## 2016-01-29 NOTE — Progress Notes (Signed)
Subjective:    Tracy Moses is a 20 y.o. female being seen today for her obstetrical visit. She is at 566w3d gestation. Patient reports no complaints. Fetal movement: normal.  Problem List Items Addressed This Visit      Other   Supervision of normal pregnancy in second trimester    Other Visit Diagnoses   None.    Patient Active Problem List   Diagnosis Date Noted  . Supervision of normal pregnancy in second trimester 11/14/2015  . 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 History of RPR test 07/26/2015   Objective:    BP 113/68   Pulse 99   Wt 154 lb 8 oz (70.1 kg)   LMP 06/02/2015 (Exact Date)   BMI 29.19 kg/m  FHT:  145 BPM  Uterine Size: 34 cm and size equals dates  Presentation: cephalic     Assessment:    Pregnancy @ 7466w3d weeks   Plan:     labs reviewed, problem list updated Consent signed. GBS planning TDAP offered  Rhogam given for RH negative Pediatrician: discussed. Infant feeding: plans to breastfeed. Maternity leave: discussed. Cigarette smoking: never smoked. No orders of the defined types were placed in this encounter.  No orders of the defined types were placed in this encounter.  Follow up in 1 Weeks.

## 2016-01-29 NOTE — Progress Notes (Signed)
Pt declines Tdap and flu vaccine at today's visit. 154.5lb 154

## 2016-02-04 ENCOUNTER — Encounter: Payer: BLUE CROSS/BLUE SHIELD | Admitting: Certified Nurse Midwife

## 2016-02-07 ENCOUNTER — Other Ambulatory Visit (HOSPITAL_COMMUNITY)
Admission: RE | Admit: 2016-02-07 | Discharge: 2016-02-07 | Disposition: A | Discharge status: Home / Self Care | Payer: Medicaid Other | Source: Ambulatory Visit | Attending: Certified Nurse Midwife | Admitting: Certified Nurse Midwife

## 2016-02-07 ENCOUNTER — Ambulatory Visit (INDEPENDENT_AMBULATORY_CARE_PROVIDER_SITE_OTHER): Payer: BLUE CROSS/BLUE SHIELD | Admitting: Certified Nurse Midwife

## 2016-02-07 VITALS — BP 116/71 | HR 89 | Temp 98.1°F | Wt 161.9 lb

## 2016-02-07 DIAGNOSIS — Z3483 Encounter for supervision of other normal pregnancy, third trimester: Secondary | ICD-10-CM

## 2016-02-07 DIAGNOSIS — Z3A35 35 weeks gestation of pregnancy: Secondary | ICD-10-CM

## 2016-02-07 DIAGNOSIS — Z113 Encounter for screening for infections with a predominantly sexual mode of transmission: Secondary | ICD-10-CM | POA: Insufficient documentation

## 2016-02-07 DIAGNOSIS — N760 Acute vaginitis: Secondary | ICD-10-CM | POA: Diagnosis present

## 2016-02-07 DIAGNOSIS — O0933 Supervision of pregnancy with insufficient antenatal care, third trimester: Secondary | ICD-10-CM

## 2016-02-07 DIAGNOSIS — O0930 Supervision of pregnancy with insufficient antenatal care, unspecified trimester: Secondary | ICD-10-CM

## 2016-02-07 LAB — OB RESULTS CONSOLE GBS: STREP GROUP B AG: POSITIVE

## 2016-02-07 NOTE — Progress Notes (Signed)
Subjective:    Tracy Moses is a 20 y.o. female being seen today for her obstetrical visit. She is at 3840w5d gestation. Patient reports no complaints. Fetal movement: normal.  Problem List Items Addressed This Visit      Other   Supervision of normal pregnancy   Relevant Orders   Cervicovaginal ancillary only   Late prenatal care    Other Visit Diagnoses    [redacted] weeks gestation of pregnancy    -  Primary   Relevant Orders   Strep Gp B NAA   Cervicovaginal ancillary only     Patient Active Problem List   Diagnosis Date Noted  . Late prenatal care 02/07/2016  . Supervision of normal pregnancy 11/14/2015  . 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 History of RPR test 07/26/2015   Objective:    BP 116/71   Pulse 89   Temp 98.1 F (36.7 C)   Wt 161 lb 14.4 oz (73.4 kg)   LMP 06/02/2015 (Exact Date)   BMI 30.59 kg/m  FHT:  132 BPM  Uterine Size: 36 cm and size equals dates  Presentation: cephalic   Cervix: soft, anterior, 0.5cm, -3 station  Assessment:    Pregnancy @ 4340w5d weeks   Doing well  Plan:     labs reviewed, problem list updated Consent signed. GBS sent TDAP offered  Rhogam given for RH negative Pediatrician: discussed. Infant feeding: plans to breastfeed. Maternity leave: discussed. Cigarette smoking: never smoked. Orders Placed This Encounter  Procedures  . Strep Gp B NAA   No orders of the defined types were placed in this encounter.  Follow up in 1 Week.

## 2016-02-07 NOTE — Patient Instructions (Addendum)
How a Baby Grows During Pregnancy Pregnancy begins when a female's sperm enters a female's egg (fertilization). This happens in one of the tubes (fallopian tubes) that connect the ovaries to the womb (uterus). The fertilized egg is called an embryo until it reaches 10 weeks. From 10 weeks until birth, it is called a fetus. The fertilized egg moves down the fallopian tube to the uterus. Then it implants into the lining of the uterus and begins to grow. The developing fetus receives oxygen and nutrients through the pregnant woman's bloodstream and the tissues that grow (placenta) to support the fetus. The placenta is the life support system for the fetus. It provides nutrition and removes waste. Learning as much as you can about your pregnancy and how your baby is developing can help you enjoy the experience. It can also make you aware of when there might be a problem and when to ask questions. HOW LONG DOES A TYPICAL PREGNANCY LAST? A pregnancy usually lasts 280 days, or about 40 weeks. Pregnancy is divided into three trimesters:  First trimester: 0-13 weeks.  Second trimester: 14-27 weeks.  Third trimester: 28-40 weeks. The day when your baby is considered ready to be born (full term) is your estimated date of delivery. HOW DOES MY BABY DEVELOP MONTH BY MONTH? First month  The fertilized egg attaches to the inside of the uterus.  Some cells will form the placenta. Others will form the fetus.  The arms, legs, brain, spinal cord, lungs, and heart begin to develop.  At the end of the first month, the heart begins to beat. Second month  The bones, inner ear, eyelids, hands, and feet form.  The genitals develop.  By the end of 8 weeks, all major organs are developing. Third month  All of the internal organs are forming.  Teeth develop below the gums.  Bones and muscles begin to grow. The spine can flex.  The skin is transparent.  Fingernails and toenails begin to form.  Arms and  legs continue to grow longer, and hands and feet develop.  The fetus is about 3 in (7.6 cm) long. Fourth month  The placenta is completely formed.  The external sex organs, neck, outer ear, eyebrows, eyelids, and fingernails are formed.  The fetus can hear, swallow, and move its arms and legs.  The kidneys begin to produce urine.  The skin is covered with a white waxy coating (vernix) and very fine hair (lanugo). Fifth month  The fetus moves around more and can be felt for the first time (quickening).  The fetus starts to sleep and wake up and may begin to suck its finger.  The nails grow to the end of the fingers.  The organ in the digestive system that makes bile (gallbladder) functions and helps to digest the nutrients.  If your baby is a girl, eggs are present in her ovaries. If your baby is a boy, testicles start to move down into his scrotum. Sixth month  The lungs are formed, but the fetus is not yet able to breathe.  The eyes open. The brain continues to develop.  Your baby has fingerprints and toe prints. Your baby's hair grows thicker.  At the end of the second trimester, the fetus is about 9 in (22.9 cm) long. Seventh month  The fetus kicks and stretches.  The eyes are developed enough to sense changes in light.  The hands can make a grasping motion.  The fetus responds to sound. Eighth month  All organs and body systems are fully developed and functioning.  Bones harden and taste buds develop. The fetus may hiccup.  Certain areas of the brain are still developing. The skull remains soft. Ninth month  The fetus gains about  lb (0.23 kg) each week.  The lungs are fully developed.  Patterns of sleep develop.  The fetus's head typically moves into a head-down position (vertex) in the uterus to prepare for birth. If the buttocks move into a vertex position instead, the baby is breech.  The fetus weighs 6-9 lbs (2.72-4.08 kg) and is 19-20 in  (48.26-50.8 cm) long. WHAT CAN I DO TO HAVE A HEALTHY PREGNANCY AND HELP MY BABY DEVELOP? Eating and Drinking  Eat a healthy diet.  Talk with your health care provider to make sure that you are getting the nutrients that you and your baby need.  Visit www.BuildDNA.es to learn about creating a healthy diet.  Gain a healthy amount of weight during pregnancy as advised by your health care provider. This is usually 25-35 pounds. You may need to:  Gain more if you were underweight before getting pregnant or if you are pregnant with more than one baby.  Gain less if you were overweight or obese when you got pregnant. Medicines and Vitamins  Take prenatal vitamins as directed by your health care provider. These include vitamins such as folic acid, iron, calcium, and vitamin D. They are important for healthy development.  Take medicines only as directed by your health care provider. Read labels and ask a pharmacist or your health care provider whether over-the-counter medicines, supplements, and prescription drugs are safe to take during pregnancy. Activities  Be physically active as advised by your health care provider. Ask your health care provider to recommend activities that are safe for you to do, such as walking or swimming.  Do not participate in strenuous or extreme sports. Lifestyle  Do not drink alcohol.  Do not use any tobacco products, including cigarettes, chewing tobacco, or electronic cigarettes. If you need help quitting, ask your health care provider.  Do not use illegal drugs. Safety  Avoid exposure to mercury, lead, or other heavy metals. Ask your health care provider about common sources of these heavy metals.  Avoid listeria infection during pregnancy. Follow these precautions:  Do not eat soft cheeses or deli meats.  Do not eat hot dogs unless they have been warmed up to the point of steaming, such as in the microwave oven.  Do not drink unpasteurized  milk.  Avoid toxoplasmosis infection during pregnancy. Follow these precautions:  Do not change your cat's litter box, if you have a cat. Ask someone else to do this for you.  Wear gardening gloves while working in the yard. General Instructions  Keep all follow-up visits as directed by your health care provider. This is important. This includes prenatal care and screening tests.  Manage any chronic health conditions. Work closely with your health care provider to keep conditions, such as diabetes, under control. HOW DO I KNOW IF MY BABY IS DEVELOPING WELL? At each prenatal visit, your health care provider will do several different tests to check on your health and keep track of your baby's development. These include:  Fundal height.  Your health care provider will measure your growing belly from top to bottom using a tape measure.  Your health care provider will also feel your belly to determine your baby's position.  Heartbeat.  An ultrasound in the first trimester can confirm  pregnancy and show a heartbeat, depending on how far along you are.  Your health care provider will check your baby's heart rate at every prenatal visit.  As you get closer to your delivery date, you may have regular fetal heart rate monitoring to make sure that your baby is not in distress.  Second trimester ultrasound.  This ultrasound checks your baby's development. It also indicates your baby's gender. WHAT SHOULD I DO IF I HAVE CONCERNS ABOUT MY BABY'S DEVELOPMENT? Always talk with your health care provider about any concerns that you may have.   This information is not intended to replace advice given to you by your health care provider. Make sure you discuss any questions you have with your health care provider.   Document Released: 10/01/2007 Document Revised: 01/03/2015 Document Reviewed: 09/21/2013 Elsevier Interactive Patient Education [employer] Third Trimester of Pregnancy The  third trimester is from week 29 through week 42, months 7 through 9. The third trimester is a time when the fetus is growing rapidly. At the end of the ninth month, the fetus is about 20 inches in length and weighs 6-10 pounds.  BODY CHANGES Your body goes through many changes during pregnancy. The changes vary from woman to woman.   Your weight will continue to increase. You can expect to gain 25-35 pounds (11-16 kg) by the end of the pregnancy.  You may begin to get stretch marks on your hips, abdomen, and breasts.  You may urinate more often because the fetus is moving lower into your pelvis and pressing on your bladder.  You may develop or continue to have heartburn as a result of your pregnancy.  You may develop constipation because certain hormones are causing the muscles that push waste through your intestines to slow down.  You may develop hemorrhoids or swollen, bulging veins (varicose veins).  You may have pelvic pain because of the weight gain and pregnancy hormones relaxing your joints between the bones in your pelvis. Backaches may result from overexertion of the muscles supporting your posture.  You may have changes in your hair. These can include thickening of your hair, rapid growth, and changes in texture. Some women also have hair loss during or after pregnancy, or hair that feels dry or thin. Your hair will most likely return to normal after your baby is born.  Your breasts will continue to grow and be tender. A yellow discharge may leak from your breasts called colostrum.  Your belly button may stick out.  You may feel short of breath because of your expanding uterus.  You may notice the fetus "dropping," or moving lower in your abdomen.  You may have a bloody mucus discharge. This usually occurs a few days to a week before labor begins.  Your cervix becomes thin and soft (effaced) near your due date. WHAT TO EXPECT AT YOUR PRENATAL EXAMS  You will have prenatal  exams every 2 weeks until week 36. Then, you will have weekly prenatal exams. During a routine prenatal visit:  You will be weighed to make sure you and the fetus are growing normally.  Your blood pressure is taken.  Your abdomen will be measured to track your baby's growth.  The fetal heartbeat will be listened to.  Any test results from the previous visit will be discussed.  You may have a cervical check near your due date to see if you have effaced. At around 36 weeks, your caregiver will check your cervix. At the same  time, your caregiver will also perform a test on the secretions of the vaginal tissue. This test is to determine if a type of bacteria, Group B streptococcus, is present. Your caregiver will explain this further. Your caregiver may ask you:  What your birth plan is.  How you are feeling.  If you are feeling the baby move.  If you have had any abnormal symptoms, such as leaking fluid, bleeding, severe headaches, or abdominal cramping.  If you are using any tobacco products, including cigarettes, chewing tobacco, and electronic cigarettes.  If you have any questions. Other tests or screenings that may be performed during your third trimester include:  Blood tests that check for low iron levels (anemia).  Fetal testing to check the health, activity level, and growth of the fetus. Testing is done if you have certain medical conditions or if there are problems during the pregnancy.  HIV (human immunodeficiency virus) testing. If you are at high risk, you may be screened for HIV during your third trimester of pregnancy. FALSE LABOR You may feel small, irregular contractions that eventually go away. These are called Braxton Hicks contractions, or false labor. Contractions may last for hours, days, or even weeks before true labor sets in. If contractions come at regular intervals, intensify, or become painful, it is best to be seen by your caregiver.  SIGNS OF LABOR    Menstrual-like cramps.  Contractions that are 5 minutes apart or less.  Contractions that start on the top of the uterus and spread down to the lower abdomen and back.  A sense of increased pelvic pressure or back pain.  A watery or bloody mucus discharge that comes from the vagina. If you have any of these signs before the 37th week of pregnancy, call your caregiver right away. You need to go to the hospital to get checked immediately. HOME CARE INSTRUCTIONS   Avoid all smoking, herbs, alcohol, and unprescribed drugs. These chemicals affect the formation and growth of the baby.  Do not use any tobacco products, including cigarettes, chewing tobacco, and electronic cigarettes. If you need help quitting, ask your health care provider. You may receive counseling support and other resources to help you quit.  Follow your caregiver's instructions regarding medicine use. There are medicines that are either safe or unsafe to take during pregnancy.  Exercise only as directed by your caregiver. Experiencing uterine cramps is a good sign to stop exercising.  Continue to eat regular, healthy meals.  Wear a good support bra for breast tenderness.  Do not use hot tubs, steam rooms, or saunas.  Wear your seat belt at all times when driving.  Avoid raw meat, uncooked cheese, cat litter boxes, and soil used by cats. These carry germs that can cause birth defects in the baby.  Take your prenatal vitamins.  Take 1500-2000 mg of calcium daily starting at the 20th week of pregnancy until you deliver your baby.  Try taking a stool softener (if your caregiver approves) if you develop constipation. Eat more high-fiber foods, such as fresh vegetables or fruit and whole grains. Drink plenty of fluids to keep your urine clear or pale yellow.  Take warm sitz baths to soothe any pain or discomfort caused by hemorrhoids. Use hemorrhoid cream if your caregiver approves.  If you develop varicose veins,  wear support hose. Elevate your feet for 15 minutes, 3-4 times a day. Limit salt in your diet.  Avoid heavy lifting, wear low heal shoes, and practice good posture.  Rest a lot with your legs elevated if you have leg cramps or low back pain.  Visit your dentist if you have not gone during your pregnancy. Use a soft toothbrush to brush your teeth and be gentle when you floss.  A sexual relationship may be continued unless your caregiver directs you otherwise.  Do not travel far distances unless it is absolutely necessary and only with the approval of your caregiver.  Take prenatal classes to understand, practice, and ask questions about the labor and delivery.  Make a trial run to the hospital.  Pack your hospital bag.  Prepare the baby's nursery.  Continue to go to all your prenatal visits as directed by your caregiver. SEEK MEDICAL CARE IF:  You are unsure if you are in labor or if your water has broken.  You have dizziness.  You have mild pelvic cramps, pelvic pressure, or nagging pain in your abdominal area.  You have persistent nausea, vomiting, or diarrhea.  You have a bad smelling vaginal discharge.  You have pain with urination. SEEK IMMEDIATE MEDICAL CARE IF:   You have a fever.  You are leaking fluid from your vagina.  You have spotting or bleeding from your vagina.  You have severe abdominal cramping or pain.  You have rapid weight loss or gain.  You have shortness of breath with chest pain.  You notice sudden or extreme swelling of your face, hands, ankles, feet, or legs.  You have not felt your baby move in over an hour.  You have severe headaches that do not go away with medicine.  You have vision changes.   This information is not intended to replace advice given to you by your health care provider. Make sure you discuss any questions you have with your health care provider.   Document Released: 04/08/2001 Document Revised: 05/05/2014 Document  Reviewed: 06/15/2012 Elsevier Interactive Patient Education [employer]

## 2016-02-09 LAB — STREP GP B NAA: STREP GROUP B AG: POSITIVE — AB

## 2016-02-11 ENCOUNTER — Other Ambulatory Visit: Payer: Self-pay | Admitting: Certified Nurse Midwife

## 2016-02-11 ENCOUNTER — Encounter: Payer: Self-pay | Admitting: *Deleted

## 2016-02-11 DIAGNOSIS — B951 Streptococcus, group B, as the cause of diseases classified elsewhere: Secondary | ICD-10-CM

## 2016-02-11 LAB — CERVICOVAGINAL ANCILLARY ONLY
Chlamydia: NEGATIVE
Neisseria Gonorrhea: NEGATIVE
TRICH (WINDOWPATH): NEGATIVE

## 2016-02-13 LAB — CERVICOVAGINAL ANCILLARY ONLY: Candida vaginitis: POSITIVE — AB

## 2016-02-18 ENCOUNTER — Ambulatory Visit (INDEPENDENT_AMBULATORY_CARE_PROVIDER_SITE_OTHER): Payer: Medicaid Other | Admitting: Obstetrics

## 2016-02-18 ENCOUNTER — Encounter: Payer: Self-pay | Admitting: Obstetrics

## 2016-02-18 VITALS — BP 128/79 | HR 92 | Temp 100.3°F | Wt 161.2 lb

## 2016-02-18 DIAGNOSIS — Z3483 Encounter for supervision of other normal pregnancy, third trimester: Secondary | ICD-10-CM | POA: Diagnosis not present

## 2016-02-18 NOTE — Progress Notes (Signed)
Patient is having slight back pain. She has had some irregular contractions- just a couple.

## 2016-02-18 NOTE — Progress Notes (Signed)
Subjective:    Tracy Moses is a 20 y.o. female being seen today for her obstetrical visit. She is at 5271w2d gestation. Patient reports no complaints. Fetal movement: normal.  Problem List Items Addressed This Visit    Supervision of normal pregnancy - Primary    Other Visit Diagnoses   None.    Patient Active Problem List   Diagnosis Date Noted  . Group beta Strep positive 02/11/2016  . Late prenatal care 02/07/2016  . Supervision of normal pregnancy 11/14/2015  . 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 History of RPR test 07/26/2015    Objective:    BP 128/79   Pulse 92   Temp 100.3 F (37.9 C)   Wt 161 lb 3.2 oz (73.1 kg)   LMP 06/02/2015 (Exact Date)   BMI 30.46 kg/m  FHT: 140 BPM  Uterine Size: size equals dates  Presentations: cephalic    Assessment:    Pregnancy @ 9071w2d weeks   Plan:   Plans for delivery: Vaginal anticipated; labs reviewed; problem list updated Counseling: Consent signed. Infant feeding: plans to breastfeed. Cigarette smoking: never smoked. L&D discussion: symptoms of labor, discussed when to call, discussed what number to call, anesthetic/analgesic options reviewed and delivering clinician:  plans no preference. Postpartum supports and preparation: circumcision discussed and contraception plans discussed.  Follow up in 1 Week.

## 2016-02-19 ENCOUNTER — Other Ambulatory Visit: Payer: Self-pay | Admitting: Certified Nurse Midwife

## 2016-02-19 DIAGNOSIS — B3731 Acute candidiasis of vulva and vagina: Secondary | ICD-10-CM

## 2016-02-19 DIAGNOSIS — B373 Candidiasis of vulva and vagina: Secondary | ICD-10-CM

## 2016-02-19 DIAGNOSIS — B9689 Other specified bacterial agents as the cause of diseases classified elsewhere: Secondary | ICD-10-CM

## 2016-02-19 DIAGNOSIS — N760 Acute vaginitis: Secondary | ICD-10-CM

## 2016-02-19 MED ORDER — FLUCONAZOLE 100 MG PO TABS: 100.0000 mg | ORAL_TABLET | Freq: Once | ORAL | 0 refills | Status: AC

## 2016-02-19 MED ORDER — TERCONAZOLE 0.8 % VA CREA: 1.0000 | TOPICAL_CREAM | Freq: Every day | VAGINAL | 0 refills | Status: DC

## 2016-02-19 MED ORDER — METRONIDAZOLE 500 MG PO TABS: 500.0000 mg | ORAL_TABLET | Freq: Two times a day (BID) | ORAL | 0 refills | Status: DC

## 2016-02-26 ENCOUNTER — Ambulatory Visit (INDEPENDENT_AMBULATORY_CARE_PROVIDER_SITE_OTHER): Payer: Medicaid Other | Admitting: Obstetrics & Gynecology

## 2016-02-26 DIAGNOSIS — Z3483 Encounter for supervision of other normal pregnancy, third trimester: Secondary | ICD-10-CM | POA: Diagnosis not present

## 2016-02-26 NOTE — Progress Notes (Signed)
   PRENATAL VISIT NOTE  Subjective:  Tracy Moses is a 20 y.o. G2P1001 at 1937w3d being seen today for ongoing prenatal care.  She is currently monitored for the following issues for this low-risk pregnancy and has History of RPR test; Marijuana use; Supervision of normal pregnancy; Late prenatal care; and Group beta Strep positive on her problem list.  Patient reports no complaints.  Contractions: Irregular. Vag. Bleeding: None.  Movement: Present. Denies leaking of fluid.   The following portions of the patient's history were reviewed and updated as appropriate: allergies, current medications, past family history, past medical history, past social history, past surgical history and problem list. Problem list updated.  Objective:   Vitals:   02/26/16 0936  BP: 125/84  Pulse: 94  Temp: 98.2 F (36.8 C)  Weight: 165 lb 6.4 oz (75 kg)    Fetal Status: Fetal Heart Rate (bpm): 123   Movement: Present     General:  Alert, oriented and cooperative. Patient is in no acute distress.  Skin: Skin is warm and dry. No rash noted.   Cardiovascular: Normal heart rate noted  Respiratory: Normal respiratory effort, no problems with respiration noted  Abdomen: Soft, gravid, appropriate for gestational age. Pain/Pressure: Present     Pelvic:  Cervical exam performed        Extremities: Normal range of motion.  Edema: Trace  Mental Status: Normal mood and affect. Normal behavior. Normal judgment and thought content.   Assessment and Plan:  Pregnancy: G2P1001 at 5637w3d  1. Encounter for supervision of other normal pregnancy in third trimester Doing well, cervix anterior  Term labor symptoms and general obstetric precautions including but not limited to vaginal bleeding, contractions, leaking of fluid and fetal movement were reviewed in detail with the patient. Please refer to After Visit Summary for other counseling recommendations.  1 week Adam PhenixJames G Jafar Poffenberger, MD

## 2016-02-26 NOTE — Patient Instructions (Signed)
Vaginal Delivery °During delivery, your health care provider will help you give birth to your baby. During a vaginal delivery, you will work to push the baby out of your vagina. However, before you can push your baby out, a few things need to happen. The opening of your uterus (cervix) has to soften, thin out, and open up (dilate) all the way to 10 cm. Also, your baby has to move down from the uterus into your vagina.  °SIGNS OF LABOR  °Your health care provider will first need to make sure you are in labor. Signs of labor include:  °· Passing what is called the mucous plug before labor begins. This is a small amount of blood-stained mucus. °· Having regular, painful uterine contractions.   °· The time between contractions gets shorter.   °· The discomfort and pain gradually get more intense. °· Contraction pains get worse when walking and do not go away when resting.   °· Your cervix becomes thinner (effacement) and dilates. °BEFORE THE DELIVERY °Once you are in labor and admitted into the hospital or care center, your health care provider may do the following:  °· Perform a complete physical exam. °· Review any complications related to pregnancy or labor.  °· Check your blood pressure, pulse, temperature, and heart rate (vital signs).   °· Determine if, and when, the rupture of amniotic membranes occurred. °· Do a vaginal exam (using a sterile glove and lubricant) to determine:   °¨ The position (presentation) of the baby. Is the baby's head presenting first (vertex) in the birth canal (vagina), or are the feet or buttocks first (breech)?   °¨ The level (station) of the baby's head within the birth canal.   °¨ The effacement and dilatation of the cervix.   °· An electronic fetal monitor is usually placed on your abdomen when you first arrive. This is used to monitor your contractions and the baby's heart rate. °¨ When the monitor is on your abdomen (external fetal monitor), it can only pick up the frequency and  length of your contractions. It cannot tell the strength of your contractions. °¨ If it becomes necessary for your health care provider to know exactly how strong your contractions are or to see exactly what the baby's heart rate is doing, an internal monitor may be inserted into your vagina and uterus. Your health care provider will discuss the benefits and risks of using an internal monitor and obtain your permission before inserting the device. °¨ Continuous fetal monitoring may be needed if you have an epidural, are receiving certain medicines (such as oxytocin), or have pregnancy or labor complications. °· An IV access tube may be placed into a vein in your arm to deliver fluids and medicines if necessary. °THREE STAGES OF LABOR AND DELIVERY °Normal labor and delivery is divided into three stages. °First Stage °This stage starts when you begin to contract regularly and your cervix begins to efface and dilate. It ends when your cervix is completely open (fully dilated). The first stage is the longest stage of labor and can last from 3 hours to 15 hours.  °Several methods are available to help with labor pain. You and your health care provider will decide which option is best for you. Options include:  °· Opioid medicines. These are strong pain medicines that you can get through your IV tube or as a shot into your muscle. These medicines lessen pain but do not make it go away completely.  °· Epidural. A medicine is given through a thin tube that   is inserted in your back. The medicine numbs the lower part of your body and prevents any pain in that area. °· Paracervical pain medicine. This is an injection of an anesthetic on each side of your cervix.   °· You may request natural childbirth, which does not involve the use of pain medicines or an epidural during labor and delivery. Instead, you will use other things, such as breathing exercises, to help cope with the pain. °Second Stage °The second stage of labor  begins when your cervix is fully dilated at 10 cm. It continues until you push your baby down through the birth canal and the baby is born. This stage can take only minutes or several hours. °· The location of your baby's head as it moves through the birth canal is reported as a number called a station. If the baby's head has not started its descent, the station is described as being at minus 3 (-3). When your baby's head is at the zero station, it is at the middle of the birth canal and is engaged in the pelvis. The station of your baby helps indicate the progress of the second stage of labor. °· When your baby is born, your health care provider may hold the baby with his or her head lowered to prevent amniotic fluid, mucus, and blood from getting into the baby's lungs. The baby's mouth and nose may be suctioned with a small bulb syringe to remove any additional fluid. °· Your health care provider may then place the baby on your stomach. It is important to keep the baby from getting cold. To do this, the health care provider will dry the baby off, place the baby directly on your skin (with no blankets between you and the baby), and cover the baby with warm, dry blankets.   °· The umbilical cord is cut. °Third Stage °During the third stage of labor, your health care provider will deliver the placenta (afterbirth) and make sure your bleeding is under control. The delivery of the placenta usually takes about 5 minutes but can take up to 30 minutes. After the placenta is delivered, a medicine may be given either by IV or injection to help contract the uterus and control bleeding. If you are planning to breastfeed, you can try to do so now. °After you deliver the placenta, your uterus should contract and get very firm. If your uterus does not remain firm, your health care provider will massage it. This is important because the contraction of the uterus helps cut off bleeding at the site where the placenta was attached  to your uterus. If your uterus does not contract properly and stay firm, you may continue to bleed heavily. If there is a lot of bleeding, medicines may be given to contract the uterus and stop the bleeding.  °  °This information is not intended to replace advice given to you by your health care provider. Make sure you discuss any questions you have with your health care provider. °  °Document Released: 01/22/2008 Document Revised: 05/05/2014 Document Reviewed: 12/10/2011 °Elsevier Interactive Patient Education ©2016 [employer] ° °

## 2016-02-26 NOTE — Progress Notes (Signed)
Patient states that she has contractions that come and go, reports good fetal movement. 

## 2016-03-04 ENCOUNTER — Inpatient Hospital Stay (HOSPITAL_COMMUNITY)
Admission: AD | Admit: 2016-03-04 | Discharge: 2016-03-04 | Disposition: A | Discharge status: Home / Self Care | Payer: Medicaid Other | Source: Ambulatory Visit | Attending: Family Medicine | Admitting: Family Medicine

## 2016-03-04 ENCOUNTER — Encounter (HOSPITAL_COMMUNITY): Payer: Self-pay

## 2016-03-04 DIAGNOSIS — Z3A40 40 weeks gestation of pregnancy: Secondary | ICD-10-CM | POA: Insufficient documentation

## 2016-03-04 DIAGNOSIS — F1290 Cannabis use, unspecified, uncomplicated: Secondary | ICD-10-CM

## 2016-03-04 DIAGNOSIS — Z9289 Personal history of other medical treatment: Secondary | ICD-10-CM

## 2016-03-04 DIAGNOSIS — Z3493 Encounter for supervision of normal pregnancy, unspecified, third trimester: Secondary | ICD-10-CM | POA: Diagnosis not present

## 2016-03-04 NOTE — MAU Note (Signed)
Pt c/o back pain and contractions during the night that come and go. Pt states baby is moving less than normal today. Pt denies bleeding and leaking of fluid. Pt denies problems with this pregnancy.

## 2016-03-06 ENCOUNTER — Encounter: Payer: Medicaid Other | Admitting: Obstetrics & Gynecology

## 2016-03-09 ENCOUNTER — Encounter (HOSPITAL_COMMUNITY): Payer: Self-pay | Admitting: Certified Nurse Midwife

## 2016-03-09 ENCOUNTER — Inpatient Hospital Stay (HOSPITAL_COMMUNITY)
Admission: AD | Admit: 2016-03-09 | Discharge: 2016-03-09 | Disposition: A | Discharge status: Home / Self Care | Payer: Medicaid Other | Source: Ambulatory Visit | Attending: Obstetrics and Gynecology | Admitting: Obstetrics and Gynecology

## 2016-03-09 DIAGNOSIS — Z3A40 40 weeks gestation of pregnancy: Secondary | ICD-10-CM | POA: Insufficient documentation

## 2016-03-09 DIAGNOSIS — Z3493 Encounter for supervision of normal pregnancy, unspecified, third trimester: Secondary | ICD-10-CM | POA: Insufficient documentation

## 2016-03-09 DIAGNOSIS — Z9289 Personal history of other medical treatment: Secondary | ICD-10-CM

## 2016-03-09 DIAGNOSIS — F1290 Cannabis use, unspecified, uncomplicated: Secondary | ICD-10-CM

## 2016-03-09 NOTE — MAU Note (Signed)
Patient presents to MAU with contractions and bleeding. Patient states contractions woke her up around 0100 and she went to the bathroom where she noticed bleeding when she wiped. Called triage nurse that told her to come in MAU.

## 2016-03-10 ENCOUNTER — Other Ambulatory Visit: Payer: Self-pay | Admitting: Obstetrics and Gynecology

## 2016-03-10 ENCOUNTER — Encounter (HOSPITAL_COMMUNITY): Payer: Self-pay | Admitting: *Deleted

## 2016-03-10 ENCOUNTER — Ambulatory Visit (INDEPENDENT_AMBULATORY_CARE_PROVIDER_SITE_OTHER): Payer: Medicaid Other | Admitting: Obstetrics and Gynecology

## 2016-03-10 ENCOUNTER — Telehealth (HOSPITAL_COMMUNITY): Payer: Self-pay | Admitting: *Deleted

## 2016-03-10 ENCOUNTER — Inpatient Hospital Stay (HOSPITAL_COMMUNITY)
Admission: AD | Admit: 2016-03-10 | Discharge: 2016-03-12 | DRG: 775 | Disposition: A | Discharge status: Home / Self Care | Payer: Medicaid Other | Source: Ambulatory Visit | Attending: Family Medicine | Admitting: Family Medicine

## 2016-03-10 ENCOUNTER — Ambulatory Visit (HOSPITAL_COMMUNITY)
Admission: RE | Admit: 2016-03-10 | Discharge: 2016-03-10 | Disposition: A | Discharge status: Home / Self Care | Payer: Medicaid Other | Source: Ambulatory Visit | Attending: Obstetrics and Gynecology | Admitting: Obstetrics and Gynecology

## 2016-03-10 VITALS — BP 120/77 | HR 76 | Wt 163.0 lb

## 2016-03-10 DIAGNOSIS — O34211 Maternal care for low transverse scar from previous cesarean delivery: Secondary | ICD-10-CM | POA: Diagnosis present

## 2016-03-10 DIAGNOSIS — Z9289 Personal history of other medical treatment: Secondary | ICD-10-CM

## 2016-03-10 DIAGNOSIS — O0930 Supervision of pregnancy with insufficient antenatal care, unspecified trimester: Secondary | ICD-10-CM

## 2016-03-10 DIAGNOSIS — Z3483 Encounter for supervision of other normal pregnancy, third trimester: Secondary | ICD-10-CM

## 2016-03-10 DIAGNOSIS — Z3A40 40 weeks gestation of pregnancy: Secondary | ICD-10-CM

## 2016-03-10 DIAGNOSIS — O99324 Drug use complicating childbirth: Secondary | ICD-10-CM | POA: Diagnosis present

## 2016-03-10 DIAGNOSIS — O480 Post-term pregnancy: Secondary | ICD-10-CM

## 2016-03-10 DIAGNOSIS — O99824 Streptococcus B carrier state complicating childbirth: Secondary | ICD-10-CM | POA: Diagnosis present

## 2016-03-10 DIAGNOSIS — O0933 Supervision of pregnancy with insufficient antenatal care, third trimester: Secondary | ICD-10-CM | POA: Diagnosis not present

## 2016-03-10 DIAGNOSIS — O9081 Anemia of the puerperium: Secondary | ICD-10-CM | POA: Diagnosis not present

## 2016-03-10 DIAGNOSIS — D649 Anemia, unspecified: Secondary | ICD-10-CM | POA: Diagnosis not present

## 2016-03-10 DIAGNOSIS — B951 Streptococcus, group B, as the cause of diseases classified elsewhere: Secondary | ICD-10-CM | POA: Diagnosis not present

## 2016-03-10 DIAGNOSIS — F1290 Cannabis use, unspecified, uncomplicated: Secondary | ICD-10-CM

## 2016-03-10 LAB — CBC
HEMATOCRIT: 33.3 % — AB (ref 36.0–46.0)
HEMOGLOBIN: 11.3 g/dL — AB (ref 12.0–15.0)
MCH: 23.8 pg — ABNORMAL LOW (ref 26.0–34.0)
MCHC: 33.9 g/dL (ref 30.0–36.0)
MCV: 70.3 fL — ABNORMAL LOW (ref 78.0–100.0)
Platelets: 229 10*3/uL (ref 150–400)
RBC: 4.74 MIL/uL (ref 3.87–5.11)
RDW: 14.9 % (ref 11.5–15.5)
WBC: 9.6 10*3/uL (ref 4.0–10.5)

## 2016-03-10 MED ORDER — LACTATED RINGERS IV SOLN
INTRAVENOUS | Status: DC
  Administered 2016-03-10 – 2016-03-11 (×2): via INTRAVENOUS

## 2016-03-10 NOTE — H&P (Signed)
LABOR AND DELIVERY ADMISSION HISTORY AND PHYSICAL NOTE  Tracy BarcelonaQuadasia L Yarbro is a 20 y.o. female G2P1001 with IUP at 6013w2d by LMP and 22 week US presenting for SOL.   She is feeling uncomfortable, does not want epidural. She reports positive fetal movement. She denies leakage of fluid or vaginal bleeding.  Prenatal History/Complications:  Past Medical History: Past Medical History:  Diagnosis Date  . Amenorrhea   . Hx of chlamydia infection   . Medical history non-contributory     Past Surgical History: Past Surgical History:  Procedure Laterality Date  . NO PAST SURGERIES      Obstetrical History: OB History    Gravida Para Term Preterm AB Living   2 1 1     1    SAB TAB Ectopic Multiple Live Births           1      Social History: Social History   Social History  . Marital status: Single    Spouse name: N/A  . Number of children: N/A  . Years of education: N/A   Occupational History  . picker socks [employer]   Social History Main Topics  . Smoking status: Never Smoker  . Smokeless tobacco: Never Used  . Alcohol use No  . Drug use: No     Comment: used approximately 1 month ago before she knew she was pregnant and none since.  . Sexual activity: Yes    Partners: Male   Other Topics Concern  . None   Social History Narrative  . None    Family History: Family History  Problem Relation Age of Onset  . Breast cancer Maternal Grandmother     Allergies: No Known Allergies  Prescriptions Prior to Admission  Medication Sig Dispense Refill Last Dose  . PRENATAL 28-0.8 MG TABS Take 1 tablet by mouth daily.    03/10/2016 at Unknown time  . terconazole (TERAZOL 3) 0.8 % vaginal cream Place 1 applicator vaginally at bedtime. 20 g 0 Past Month at Unknown time     Review of Systems   All systems reviewed and negative except as stated in HPI  Blood pressure 127/67, pulse 77, temperature 98.7 F (37.1 C), resp. rate 17, height 5\' 1"  (1.549 m), weight  75.3 kg (166 lb), last menstrual period 06/02/2015, SpO2 100 %. General appearance: alert, cooperative and no distress Lungs: no respiratory distress Heart: regular rate Abdomen: soft, non-tender Extremities: No calf swelling or tenderness Presentation: cephalic by nursing exam Fetal monitoring: baseline 120, moderate variability, accelerations present, no decelerations Uterine activity: q3-4 minutes Dilation: 4 Effacement (%): 80 Station: -2 Exam by:: Domenic PoliteK. WEissRN   Prenatal labs: ABO, Rh: A/Positive/-- (03/29 1621) Antibody: Negative (03/29 1621) Rubella: non immune RPR: Non Reactive (08/16 1700)  HBsAg: Negative (03/29 1621)  HIV: Non Reactive (08/16 1700)  GBS: Positive (10/12 1658)   Prenatal Transfer Tool  Maternal Diabetes: No Genetic Screening: Declined Maternal Ultrasounds/Referrals: Normal Fetal Ultrasounds or other Referrals:  Referred to Materal Fetal Medicine  Maternal Substance Abuse:  No Significant Maternal Medications:  None Significant Maternal Lab Results: None  No results found for this or any previous visit (from the past 24 hour(s)).  Patient Active Problem List   Diagnosis Date Noted  . Group beta Strep positive 02/11/2016  . Late prenatal care 02/07/2016  . Supervision of normal pregnancy 11/14/2015  . 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 History of RPR test 07/26/2015    Assessment: Tracy Moses is a 20 y.o. G2P1001  at 7955w2d here for SOL  #Labor: SOL, progressing naturally #Pain: IV pain medication #FWB: Category 1 tracing #ID:  GBS positive- will treat with PCN #MOF: both #MOC:undecided #Circ:  outpatient  Tillman SersAngela C Riccio, DO PGY-1 11/13/201711:30 PM  The patient was seen and examined by me also Agree with note NST reactive and reassuring UCs as listed Cervical exams as listed in note [facility] Routine orders Anticipate SVD  Aviva SignsMarie L Williams, CNM

## 2016-03-10 NOTE — Telephone Encounter (Signed)
Preadmission screen  

## 2016-03-10 NOTE — Addendum Note (Signed)
Addended by: Catalina AntiguaONSTANT, Aisha Greenberger on: 03/10/2016 12:07 PM   Modules accepted: Orders

## 2016-03-10 NOTE — Progress Notes (Signed)
   PRENATAL VISIT NOTE  Subjective:  Tracy Moses is a 20 y.o. G2P1001 at 724w2d being seen today for ongoing prenatal care.  She is currently monitored for the following issues for this low-risk pregnancy and has History of RPR test; Marijuana use; Supervision of normal pregnancy; Late prenatal care; and Group beta Strep positive on her problem list.  Patient reports no complaints.  Contractions: Irregular. Vag. Bleeding: None.  Movement: Present. Denies leaking of fluid.   The following portions of the patient's history were reviewed and updated as appropriate: allergies, current medications, past family history, past medical history, past social history, past surgical history and problem list. Problem list updated.  Objective:   Vitals:   03/10/16 1125  BP: 120/77  Pulse: 76  Weight: 163 lb (73.9 kg)    Fetal Status: Fetal Heart Rate (bpm): NST   Movement: Present     General:  Alert, oriented and cooperative. Patient is in no acute distress.  Skin: Skin is warm and dry. No rash noted.   Cardiovascular: Normal heart rate noted  Respiratory: Normal respiratory effort, no problems with respiration noted  Abdomen: Soft, gravid, appropriate for gestational age. Pain/Pressure: Present     Pelvic:  Cervical exam deferred        Extremities: Normal range of motion.     Mental Status: Normal mood and affect. Normal behavior. Normal judgment and thought content.   Assessment and Plan:  Pregnancy: G2P1001 at 7324w2d  1. Encounter for supervision of other normal pregnancy in third trimester Patient is doing well without complaints NST reviewed and reactive Will schedule AFI check for later this week IOL at 41 weeks on 11/18 to be scheduled - US OB Limited; Future  2. Group beta Strep positive Will receive prophylaxis in labor  3. Late prenatal care   Term labor symptoms and general obstetric precautions including but not limited to vaginal bleeding, contractions, leaking of  fluid and fetal movement were reviewed in detail with the patient. Please refer to After Visit Summary for other counseling recommendations.  No Follow-up on file.   Catalina AntiguaPeggy Chalmers Iddings, MD

## 2016-03-10 NOTE — MAU Note (Signed)
Pt reports contractions, denies bleeding or ROM.  

## 2016-03-11 ENCOUNTER — Encounter (HOSPITAL_COMMUNITY): Payer: Self-pay

## 2016-03-11 DIAGNOSIS — Z3A40 40 weeks gestation of pregnancy: Secondary | ICD-10-CM

## 2016-03-11 DIAGNOSIS — O99324 Drug use complicating childbirth: Secondary | ICD-10-CM | POA: Diagnosis present

## 2016-03-11 DIAGNOSIS — O99824 Streptococcus B carrier state complicating childbirth: Secondary | ICD-10-CM | POA: Diagnosis present

## 2016-03-11 DIAGNOSIS — O34211 Maternal care for low transverse scar from previous cesarean delivery: Secondary | ICD-10-CM | POA: Diagnosis present

## 2016-03-11 DIAGNOSIS — F1290 Cannabis use, unspecified, uncomplicated: Secondary | ICD-10-CM | POA: Diagnosis present

## 2016-03-11 DIAGNOSIS — D649 Anemia, unspecified: Secondary | ICD-10-CM | POA: Diagnosis not present

## 2016-03-11 DIAGNOSIS — O9081 Anemia of the puerperium: Secondary | ICD-10-CM | POA: Diagnosis not present

## 2016-03-11 DIAGNOSIS — Z3403 Encounter for supervision of normal first pregnancy, third trimester: Secondary | ICD-10-CM | POA: Diagnosis present

## 2016-03-11 LAB — TYPE AND SCREEN
ABO/RH(D): A POS
Antibody Screen: NEGATIVE

## 2016-03-11 LAB — ABO/RH: ABO/RH(D): A POS

## 2016-03-11 MED ORDER — PENICILLIN G POT IN DEXTROSE 60000 UNIT/ML IV SOLN
3.0000 10*6.[IU] | INTRAVENOUS | Status: DC
  Administered 2016-03-11: 3 10*6.[IU] via INTRAVENOUS
  Filled 2016-03-11 (×3): qty 50

## 2016-03-11 MED ORDER — LIDOCAINE HCL (PF) 1 % IJ SOLN
30.0000 mL | INTRAMUSCULAR | Status: DC | PRN
  Administered 2016-03-11: 30 mL via SUBCUTANEOUS
  Filled 2016-03-11: qty 30

## 2016-03-11 MED ORDER — METHYLERGONOVINE MALEATE 0.2 MG/ML IJ SOLN
INTRAMUSCULAR | Status: AC
  Filled 2016-03-11: qty 1

## 2016-03-11 MED ORDER — FENTANYL 2.5 MCG/ML BUPIVACAINE 1/10 % EPIDURAL INFUSION (WH - ANES): 14.0000 mL/h | INTRAMUSCULAR | Status: DC | PRN

## 2016-03-11 MED ORDER — TETANUS-DIPHTH-ACELL PERTUSSIS 5-2.5-18.5 LF-MCG/0.5 IM SUSP: 0.5000 mL | Freq: Once | INTRAMUSCULAR | Status: DC

## 2016-03-11 MED ORDER — OXYTOCIN BOLUS FROM INFUSION
500.0000 mL | Freq: Once | INTRAVENOUS | Status: AC
  Administered 2016-03-11: 500 mL via INTRAVENOUS

## 2016-03-11 MED ORDER — SOD CITRATE-CITRIC ACID 500-334 MG/5ML PO SOLN: 30.0000 mL | ORAL | Status: DC | PRN

## 2016-03-11 MED ORDER — WITCH HAZEL-GLYCERIN EX PADS: 1.0000 "application " | MEDICATED_PAD | CUTANEOUS | Status: DC | PRN

## 2016-03-11 MED ORDER — EPHEDRINE 5 MG/ML INJ
10.0000 mg | INTRAVENOUS | Status: DC | PRN
  Filled 2016-03-11: qty 4

## 2016-03-11 MED ORDER — FENTANYL CITRATE (PF) 100 MCG/2ML IJ SOLN
100.0000 ug | INTRAMUSCULAR | Status: DC | PRN
  Administered 2016-03-11 (×2): 100 ug via INTRAVENOUS
  Filled 2016-03-11 (×2): qty 2

## 2016-03-11 MED ORDER — ONDANSETRON HCL 4 MG PO TABS: 4.0000 mg | ORAL_TABLET | ORAL | Status: DC | PRN

## 2016-03-11 MED ORDER — ONDANSETRON HCL 4 MG/2ML IJ SOLN: 4.0000 mg | INTRAMUSCULAR | Status: DC | PRN

## 2016-03-11 MED ORDER — PRENATAL MULTIVITAMIN CH
1.0000 | ORAL_TABLET | Freq: Every day | ORAL | Status: DC
  Administered 2016-03-12: 1 via ORAL
  Filled 2016-03-11: qty 1

## 2016-03-11 MED ORDER — BENZOCAINE-MENTHOL 20-0.5 % EX AERO
1.0000 "application " | INHALATION_SPRAY | CUTANEOUS | Status: DC | PRN
  Filled 2016-03-11: qty 56

## 2016-03-11 MED ORDER — METHYLERGONOVINE MALEATE 0.2 MG/ML IJ SOLN
0.2000 mg | Freq: Once | INTRAMUSCULAR | Status: AC
  Administered 2016-03-11: 0.2 mg via INTRAMUSCULAR

## 2016-03-11 MED ORDER — OXYCODONE-ACETAMINOPHEN 5-325 MG PO TABS: 1.0000 | ORAL_TABLET | ORAL | Status: DC | PRN

## 2016-03-11 MED ORDER — ZOLPIDEM TARTRATE 5 MG PO TABS: 5.0000 mg | ORAL_TABLET | Freq: Every evening | ORAL | Status: DC | PRN

## 2016-03-11 MED ORDER — DIPHENHYDRAMINE HCL 50 MG/ML IJ SOLN: 12.5000 mg | INTRAMUSCULAR | Status: DC | PRN

## 2016-03-11 MED ORDER — ACETAMINOPHEN 325 MG PO TABS: 650.0000 mg | ORAL_TABLET | ORAL | Status: DC | PRN

## 2016-03-11 MED ORDER — PHENYLEPHRINE 40 MCG/ML (10ML) SYRINGE FOR IV PUSH (FOR BLOOD PRESSURE SUPPORT)
80.0000 ug | PREFILLED_SYRINGE | INTRAVENOUS | Status: DC | PRN
  Filled 2016-03-11: qty 5

## 2016-03-11 MED ORDER — SENNOSIDES-DOCUSATE SODIUM 8.6-50 MG PO TABS
2.0000 | ORAL_TABLET | ORAL | Status: DC
  Administered 2016-03-11: 2 via ORAL
  Filled 2016-03-11: qty 2

## 2016-03-11 MED ORDER — LACTATED RINGERS IV SOLN: 500.0000 mL | INTRAVENOUS | Status: DC | PRN

## 2016-03-11 MED ORDER — ONDANSETRON HCL 4 MG/2ML IJ SOLN: 4.0000 mg | Freq: Four times a day (QID) | INTRAMUSCULAR | Status: DC | PRN

## 2016-03-11 MED ORDER — DIPHENHYDRAMINE HCL 25 MG PO CAPS: 25.0000 mg | ORAL_CAPSULE | Freq: Four times a day (QID) | ORAL | Status: DC | PRN

## 2016-03-11 MED ORDER — OXYCODONE-ACETAMINOPHEN 5-325 MG PO TABS: 2.0000 | ORAL_TABLET | ORAL | Status: DC | PRN

## 2016-03-11 MED ORDER — DIBUCAINE 1 % RE OINT: 1.0000 "application " | TOPICAL_OINTMENT | RECTAL | Status: DC | PRN

## 2016-03-11 MED ORDER — MISOPROSTOL 200 MCG PO TABS
ORAL_TABLET | ORAL | Status: AC
  Filled 2016-03-11: qty 4

## 2016-03-11 MED ORDER — PENICILLIN G POTASSIUM 5000000 UNITS IJ SOLR
5.0000 10*6.[IU] | Freq: Once | INTRAVENOUS | Status: AC
  Administered 2016-03-11: 5 10*6.[IU] via INTRAVENOUS
  Filled 2016-03-11: qty 5

## 2016-03-11 MED ORDER — SIMETHICONE 80 MG PO CHEW: 80.0000 mg | CHEWABLE_TABLET | ORAL | Status: DC | PRN

## 2016-03-11 MED ORDER — IBUPROFEN 600 MG PO TABS
600.0000 mg | ORAL_TABLET | Freq: Four times a day (QID) | ORAL | Status: DC
  Administered 2016-03-11 – 2016-03-12 (×5): 600 mg via ORAL
  Filled 2016-03-11 (×5): qty 1

## 2016-03-11 MED ORDER — COCONUT OIL OIL: 1.0000 "application " | TOPICAL_OIL | Status: DC | PRN

## 2016-03-11 MED ORDER — OXYTOCIN 40 UNITS IN LACTATED RINGERS INFUSION - SIMPLE MED
2.5000 [IU]/h | INTRAVENOUS | Status: DC
Start: 2016-03-11 — End: 2016-03-11
  Filled 2016-03-11: qty 1000

## 2016-03-11 MED ORDER — LACTATED RINGERS IV SOLN: 500.0000 mL | Freq: Once | INTRAVENOUS | Status: DC

## 2016-03-11 NOTE — Anesthesia Pain Management Evaluation Note (Signed)
  CRNA Pain Management Visit Note  Patient: Tracy Moses, 20 y.o., female  "Hello I am a member of the anesthesia team at [facility]. We have an anesthesia team available at all times to provide care throughout the hospital, including epidural management and anesthesia for C-section. I don't know your plan for the delivery whether it a natural birth, water birth, IV sedation, nitrous supplementation, doula or epidural, but we want to meet your pain goals."   1.Was your pain managed to your expectations on prior hospitalizations?   Yes   2.What is your expectation for pain management during this hospitalization?     IV pain meds  3.How can we help you reach that goal? Currently requesting IV pain meds, Be available  Record the patient's initial score and the patient's pain goal.   Pain: 8  Pain Goal: 5 [facility] wants you to be able to say your pain was always managed very well.  [facility] Out[facility] Surgery LtdMERRITT,Yamir Carignan 03/11/2016

## 2016-03-12 LAB — CBC
HEMATOCRIT: 26.6 % — AB (ref 36.0–46.0)
HEMOGLOBIN: 9.1 g/dL — AB (ref 12.0–15.0)
MCH: 24 pg — AB (ref 26.0–34.0)
MCHC: 34.2 g/dL (ref 30.0–36.0)
MCV: 70.2 fL — AB (ref 78.0–100.0)
Platelets: 208 10*3/uL (ref 150–400)
RBC: 3.79 MIL/uL — AB (ref 3.87–5.11)
RDW: 14.8 % (ref 11.5–15.5)
WBC: 14.3 10*3/uL — ABNORMAL HIGH (ref 4.0–10.5)

## 2016-03-12 LAB — RPR, QUANT+TP ABS (REFLEX): T Pallidum Abs: NEGATIVE

## 2016-03-12 LAB — RPR: RPR: REACTIVE — AB

## 2016-03-12 MED ORDER — IBUPROFEN 600 MG PO TABS: 600.0000 mg | ORAL_TABLET | Freq: Four times a day (QID) | ORAL | 0 refills | Status: DC

## 2016-03-12 MED ORDER — FUSION PLUS PO CAPS: 1.0000 | ORAL_CAPSULE | Freq: Two times a day (BID) | ORAL | 4 refills | Status: DC

## 2016-03-12 MED ORDER — MEASLES, MUMPS & RUBELLA VAC ~~LOC~~ INJ
0.5000 mL | INJECTION | Freq: Once | SUBCUTANEOUS | Status: DC
  Filled 2016-03-12: qty 0.5

## 2016-03-12 MED ORDER — OXYCODONE-ACETAMINOPHEN 5-325 MG PO TABS: 1.0000 | ORAL_TABLET | ORAL | 0 refills | Status: DC | PRN

## 2016-03-12 MED ORDER — SENNOSIDES-DOCUSATE SODIUM 8.6-50 MG PO TABS: 2.0000 | ORAL_TABLET | Freq: Two times a day (BID) | ORAL | 2 refills | Status: DC

## 2016-03-12 MED ORDER — VITAFOL FE+ 90-1-200 & 50 MG PO CPPK: 2.0000 | ORAL_CAPSULE | Freq: Every day | ORAL | 12 refills | Status: DC

## 2016-03-12 NOTE — Clinical Social Work Maternal (Signed)
  CLINICAL SOCIAL WORK MATERNAL/CHILD NOTE  Patient Details  Name: Tracy Moses MRN: [id number] Date of Birth: 1995/07/11  Date:  03/12/2016  Clinical Social Worker Initiating Note:  Laurey Arrow Date/ Time Initiated:  03/12/16/1440     Child's Name:  Polly Cobia.    Legal Guardian:      Need for Interpreter:  None   Date of Referral:  03/05/16     Reason for Referral:  Current Substance Use/Substance Use During Pregnancy    Referral Source:  Central Nursery   Address:  [address] [place] [address]  Phone number:  [phone]   Household Members:  Self   Natural Supports (not living in the home):  Children, Immediate Family, Parent   Professional Supports: None   Employment: Unemployed   Type of Work:     Education:  Database administrator Resources:  [employer]   Other Resources:  [employer], Physicist, medical    Cultural/Religious Considerations Which May Impact Care:  None Reported  Strengths:  Ability to meet basic needs , Home prepared for child    Risk Factors/Current Problems:  Substance Use    Cognitive State:  Alert , Able to Concentrate , Linear Thinking    Mood/Affect:  Bright , Happy , Comfortable , Interested    CSW Assessment: CSW met with MOB to complete an assessment for THC use in pregnancy.  MOB was polite and receptive to meeting with CSW. CSW inquired about MOB's substance use, and MOB acknowledged she utilized marijuana before MOB's pregnancy was confirmed.  MOB reported that after pregnancy confirmation MOB discontinue using marijuana. CSW informed MOB of the hospital's drug screen policy, and informed MOB of the 2 screenings for the infant.  MOB was understanding and denied utilizing any substance prior to her pregnancy confirmation. MOB expressed that MOB was not concerned about the drug screens and continued to report that MOB is no longer using marijuana.  CSW shared that the infant's UDS was negative  and CSW will continue to monitor the infant's cord, and will make a report to CPS if needed.  MOB stated she was not concerned, and did not have any questions about the hospital's policy. CSW educated MOB about PPD. CSW informed MOB of possible supports and interventions to decrease PPD.  CSW also encouraged MOB to seek medical attention if needed for increased signs and symptoms for PPD. CSW also reviewed safe sleep and SIDS. MOB was knowledgeable.  MOB communicated that she has a bassinet for the infant and is aware of safe sleep interventions. CSW thanked MOB for MOB's willingness to meet with CSW.  MOB did not have any further questions, concerns, or needs at this time. CSW Plan/Description:  Information/Referral to [employer] , [employer] , No Further Intervention Required/No Barriers to Discharge (CSW will follow infant's cord and will make a report to [place] if warranted. )    Aashka Salomone D BOYD-GILYARD, LCSW  03/12/2016, 2:42 PM  Laurey Arrow, MSW, [place] [facility] [phone]

## 2016-03-12 NOTE — Progress Notes (Signed)
UR chart review completed.  

## 2016-03-12 NOTE — Discharge Summary (Signed)
Obstetric Discharge Summary Reason for Admission: onset of labor and postdates, 6724w2d, TOLAC Prenatal Procedures: NST and ultrasound Intrapartum Procedures: spontaneous vaginal delivery Postpartum Procedures: none Complications-Operative and Postpartum: periurethral laceration Hemoglobin  Date Value Ref Range Status  03/12/2016 9.1 (L) 12.0 - 15.0 g/dL Final   HGB  Date Value Ref Range Status  07/13/2013 11.4 (L) 12.0 - 16.0 g/dL Final   HCT  Date Value Ref Range Status  03/12/2016 26.6 (L) 36.0 - 46.0 % Final   Hematocrit  Date Value Ref Range Status  12/12/2015 29.7 (L) 34.0 - 46.6 % Final    Physical Exam:  General: alert, cooperative and no distress Lochia: appropriate Uterine Fundus: firm Incision: no significant drainage, no dehiscence, no significant erythema DVT Evaluation: No evidence of DVT seen on physical exam. No cords or calf tenderness. No significant calf/ankle edema.  Discharge Diagnoses: Term Pregnancy-delivered and VBAC  Discharge Information: Date: 03/12/2016 Activity: pelvic rest Diet: routine Medications: PNV, Ibuprofen, Colace, Iron and Percocet Condition: stable Instructions: refer to practice specific booklet Discharge to: home Follow-up Information    Rachelle A Denney, CNM Follow up in 4 week(s).   Specialty:  Certified Nurse Midwife Contact information: [address] [place] [phone]           Newborn Data: Live born female  Birth Weight: 6 lb 7.9 oz (2945 g) APGAR: 8, 9  Home with mother.  Roe Coombsachelle A Denney, CNM 03/12/2016, 7:32 AM

## 2016-03-12 NOTE — Progress Notes (Signed)
Post Partum Day #1 Subjective: no complaints, up ad lib, voiding and tolerating PO  Objective: Blood pressure (!) 107/56, pulse 73, temperature 97.4 F (36.3 C), temperature source Oral, resp. rate 18, height 5\' 1"  (1.549 m), weight 166 lb (75.3 kg), last menstrual period 06/02/2015, SpO2 99 %, unknown if currently breastfeeding.  Physical Exam:  General: alert, cooperative and no distress Lochia: appropriate Uterine Fundus: firm Incision: no significant drainage, no dehiscence, no significant erythema DVT Evaluation: No evidence of DVT seen on physical exam. No cords or calf tenderness. No significant calf/ankle edema.   Recent Labs  03/10/16 2333 03/12/16 0523  HGB 11.3* 9.1*  HCT 33.3* 26.6*    Assessment/Plan: Discharge home and Contraception undecided, choices discussed.   Anemia: iron started, VSS, no syncope, HA.   LOS: 1 day   Roe CoombsRachelle A Lesleyanne Politte, CNM 03/12/2016, 7:27 AM

## 2016-03-14 ENCOUNTER — Inpatient Hospital Stay (HOSPITAL_COMMUNITY)
Admission: AD | Admit: 2016-03-14 | Discharge: 2016-03-14 | Disposition: A | Discharge status: Home / Self Care | Payer: Medicaid Other | Source: Ambulatory Visit | Attending: Obstetrics & Gynecology | Admitting: Obstetrics & Gynecology

## 2016-03-14 ENCOUNTER — Encounter (HOSPITAL_COMMUNITY): Payer: Self-pay | Admitting: *Deleted

## 2016-03-14 DIAGNOSIS — Z803 Family history of malignant neoplasm of breast: Secondary | ICD-10-CM | POA: Diagnosis not present

## 2016-03-14 DIAGNOSIS — N939 Abnormal uterine and vaginal bleeding, unspecified: Secondary | ICD-10-CM | POA: Diagnosis not present

## 2016-03-14 DIAGNOSIS — Z79899 Other long term (current) drug therapy: Secondary | ICD-10-CM | POA: Insufficient documentation

## 2016-03-14 NOTE — MAU Note (Addendum)
thought a clot was trying to come out.  Wiped with tissue, nothing.  She and her mom looked, something is hanging down.  Kind of looks like umbilical cord.  No pain or pressure. Vag delivery 11/14

## 2016-03-14 NOTE — Discharge Instructions (Signed)

## 2016-03-14 NOTE — MAU Provider Note (Signed)
History     CSN: [id number]  Arrival date and time: 03/14/16 1227   First Provider Initiated Contact with Patient 03/14/16 1327      Chief Complaint  Patient presents with  . possible uterine prolapse   HPI Tracy Moses is a 20 y.o. [id number] who presents to MAU today with complaint of possible uterine prolapse. The patient had SVD on 11/14 after an uncomplicated pregnancy. She states bleeding is similar to a period. She denies abdominal pain or fever. She states that this morning she felt like she was going to pass a blood clot but it did not pass. Her mother looked at the area and noted something "coming out" of the vagina that "looked like umbilical cord."   OB History    Gravida Para Term Preterm AB Living   2 2 2     2    SAB TAB Ectopic Multiple Live Births         0 2      Past Medical History:  Diagnosis Date  . Amenorrhea   . Hx of chlamydia infection   . Medical history non-contributory     Past Surgical History:  Procedure Laterality Date  . NO PAST SURGERIES      Family History  Problem Relation Age of Onset  . Breast cancer Maternal Grandmother     Social History  Substance Use Topics  . Smoking status: Never Smoker  . Smokeless tobacco: Never Used  . Alcohol use No    Allergies: No Known Allergies  Prescriptions Prior to Admission  Medication Sig Dispense Refill Last Dose  . ibuprofen (ADVIL,MOTRIN) 600 MG tablet Take 1 tablet (600 mg total) by mouth every 6 (six) hours. 120 tablet 0   . Iron-FA-B Cmp-C-Biot-Probiotic (FUSION PLUS) CAPS Take 1 tablet by mouth 2 (two) times daily. 60 capsule 4   . oxyCODONE-acetaminophen (PERCOCET/ROXICET) 5-325 MG tablet Take 1-2 tablets by mouth every 4 (four) hours as needed (pain scale > 7). 45 tablet 0   . Prenat-FePoly-Metf-FA-DHA-DSS (VITAFOL FE+) 90-1-200 & 50 MG CPPK Take 2 tablets by mouth daily. 60 each 12   . senna-docusate (SENOKOT-S) 8.6-50 MG tablet Take 2 tablets by mouth 2 (two) times  daily. 90 tablet 2     Review of Systems  Constitutional: Negative for fever.  Gastrointestinal: Negative for abdominal pain.  Genitourinary:       + vaginal bleeding   Physical Exam   Blood pressure 125/79, pulse 81, temperature 98.1 F (36.7 C), temperature source Oral, resp. rate 16, not currently breastfeeding.  Physical Exam  Nursing note and vitals reviewed. Constitutional: She is oriented to person, place, and time. She appears well-developed and well-nourished. No distress.  HENT:  Head: Normocephalic and atraumatic.  Cardiovascular: Normal rate.   Respiratory: Effort normal.  GI: Soft. She exhibits no distension.  Genitourinary: There is bleeding in the vagina. No vaginal discharge found.  Genitourinary Comments: Small amount of retained POC resembling amniotic sac noted at the introitus. Removed without difficulty. No additional POC or clots noted.   Neurological: She is alert and oriented to person, place, and time.  Skin: Skin is warm and dry. No erythema.  Psychiatric: She has a normal mood and affect.    MAU Course  Procedures None  MDM   Assessment and Plan  A: POC removed from vagina   P: Discharge home Bleeding/infection precautions discussed Patient advised to follow-up with [facility] for routine PP visit. Patient will call to make appointment.  Patient may return to MAU as needed or if her condition were to change or worsen   Marny LowensteinJulie N Wenzel, PA-C  03/14/2016, 1:27 PM

## 2016-03-15 ENCOUNTER — Inpatient Hospital Stay (HOSPITAL_COMMUNITY): Admission: RE | Admit: 2016-03-15 | Payer: Medicaid Other | Source: Ambulatory Visit

## 2016-04-08 ENCOUNTER — Ambulatory Visit: Payer: Medicaid Other | Admitting: Family Medicine

## 2016-04-08 ENCOUNTER — Ambulatory Visit: Payer: Medicaid Other | Admitting: Certified Nurse Midwife

## 2016-05-01 ENCOUNTER — Ambulatory Visit: Payer: Medicaid Other | Admitting: Family Medicine

## 2017-07-15 DIAGNOSIS — A6000 Herpesviral infection of urogenital system, unspecified: Secondary | ICD-10-CM | POA: Insufficient documentation

## 2018-03-10 ENCOUNTER — Emergency Department: Payer: No Typology Code available for payment source

## 2018-03-10 ENCOUNTER — Other Ambulatory Visit: Payer: Self-pay

## 2018-03-10 ENCOUNTER — Emergency Department
Admission: EM | Admit: 2018-03-10 | Discharge: 2018-03-10 | Disposition: A | Discharge status: Home / Self Care | Payer: No Typology Code available for payment source | Attending: Emergency Medicine | Admitting: Emergency Medicine

## 2018-03-10 ENCOUNTER — Encounter: Payer: Self-pay | Admitting: Emergency Medicine

## 2018-03-10 DIAGNOSIS — S39012A Strain of muscle, fascia and tendon of lower back, initial encounter: Secondary | ICD-10-CM | POA: Diagnosis not present

## 2018-03-10 DIAGNOSIS — S161XXA Strain of muscle, fascia and tendon at neck level, initial encounter: Secondary | ICD-10-CM | POA: Diagnosis not present

## 2018-03-10 DIAGNOSIS — Y92410 Unspecified street and highway as the place of occurrence of the external cause: Secondary | ICD-10-CM | POA: Insufficient documentation

## 2018-03-10 DIAGNOSIS — Y999 Unspecified external cause status: Secondary | ICD-10-CM | POA: Insufficient documentation

## 2018-03-10 DIAGNOSIS — Y9389 Activity, other specified: Secondary | ICD-10-CM | POA: Diagnosis not present

## 2018-03-10 DIAGNOSIS — S3992XA Unspecified injury of lower back, initial encounter: Secondary | ICD-10-CM | POA: Diagnosis present

## 2018-03-10 LAB — POCT PREGNANCY, URINE: Preg Test, Ur: NEGATIVE
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
## 2018-03-10 MED ORDER — BACLOFEN 10 MG PO TABS: 10.0000 mg | ORAL_TABLET | Freq: Three times a day (TID) | ORAL | 1 refills | Status: AC
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
## 2018-03-10 NOTE — Discharge Instructions (Addendum)
Follow-up with your regular doctor or the acute care if you are not better in 2 to 3 days.  Follow-up with Dr. Ernest PineHooten for evaluation of the scoliosis.  Return to the emergency department if you are worsening.  Take Tylenol or ibuprofen for pain as needed.  Baclofen for muscle spasms if needed.  Apply ice to all areas that hurt.

## 2018-11-08 DIAGNOSIS — A6000 Herpesviral infection of urogenital system, unspecified: Secondary | ICD-10-CM

## 2018-11-10 ENCOUNTER — Ambulatory Visit: Payer: Self-pay

## 2019-09-15 ENCOUNTER — Other Ambulatory Visit: Payer: Self-pay

## 2019-09-15 ENCOUNTER — Emergency Department: Payer: Self-pay

## 2019-09-15 ENCOUNTER — Emergency Department
Admission: EM | Admit: 2019-09-15 | Discharge: 2019-09-15 | Disposition: A | Discharge status: Home / Self Care | Payer: Self-pay | Attending: Emergency Medicine | Admitting: Emergency Medicine

## 2019-09-15 DIAGNOSIS — M545 Low back pain, unspecified: Secondary | ICD-10-CM

## 2019-09-15 DIAGNOSIS — M5412 Radiculopathy, cervical region: Secondary | ICD-10-CM | POA: Insufficient documentation

## 2019-09-15 LAB — BASIC METABOLIC PANEL
Anion gap: 8 (ref 5–15)
BUN: 22 mg/dL — ABNORMAL HIGH (ref 6–20)
CO2: 25 mmol/L (ref 22–32)
Calcium: 9.2 mg/dL (ref 8.9–10.3)
Chloride: 107 mmol/L (ref 98–111)
Creatinine, Ser: 0.8 mg/dL (ref 0.44–1.00)
GFR calc Af Amer: >60 mL/min (ref >60)
GFR calc non Af Amer: >60 mL/min (ref >60)
Glucose, Bld: 97 mg/dL (ref 70–99)
Potassium: 4 mmol/L (ref 3.5–5.1)
Sodium: 140 mmol/L (ref 135–145)

## 2019-09-15 LAB — CBC
HCT: 36.6 % (ref 36.0–46.0)
Hemoglobin: 11.8 g/dL — ABNORMAL LOW (ref 12.0–15.0)
MCH: 22.9 pg — ABNORMAL LOW (ref 26.0–34.0)
MCHC: 32.2 g/dL (ref 30.0–36.0)
MCV: 71.1 fL — ABNORMAL LOW (ref 80.0–100.0)
Platelets: 315 10*3/uL (ref 150–400)
RBC: 5.15 MIL/uL — ABNORMAL HIGH (ref 3.87–5.11)
RDW: 14.1 % (ref 11.5–15.5)
WBC: 8.3 10*3/uL (ref 4.0–10.5)
nRBC: 0 % (ref 0.0–0.2)

## 2019-09-15 LAB — TROPONIN I (HIGH SENSITIVITY): Troponin I (High Sensitivity): <2 ng/L (ref <18)

## 2019-09-15 MED ORDER — BACLOFEN 10 MG PO TABS: 10.0000 mg | ORAL_TABLET | Freq: Three times a day (TID) | ORAL | 1 refills | Status: AC

## 2019-09-15 MED ORDER — MELOXICAM 15 MG PO TABS: 15.0000 mg | ORAL_TABLET | Freq: Every day | ORAL | 2 refills | Status: AC

## 2019-09-15 NOTE — Discharge Instructions (Signed)
Follow-up with orthopedics or chiropractor.  Please.  Use medications as prescribed.  Apply ice to the spine.  Return if worsening

## 2019-09-15 NOTE — ED Provider Notes (Signed)
[facility] Emergency Department Provider Note  ____________________________________________   First MD Initiated Contact with Patient 09/15/19 1247     (approximate)  I have reviewed the triage vital signs and the nursing notes.   HISTORY  Chief Complaint Back Pain    HPI Tracy Moses is a 24 y.o. female presents emergency department complaint of back pain.  Patient has history of scoliosis.  Patient states that last night she could not get comfortable in bed felt like she had some chest pain from across the left side of her chest and the right shoulder.  No vomiting.  No diaphoresis.  States that she does feels like tingling in the arms and hands.  No fever or chills.    Past Medical History:  Diagnosis Date  . Amenorrhea   . Hx of chlamydia infection   . Medical history non-contributory     Patient Active Problem List   Diagnosis Date Noted  . 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 Marijuana use 07/31/2015    Past Surgical History:  Procedure Laterality Date  . NO PAST SURGERIES      Prior to Admission medications   Medication Sig Start Date End Date Taking? Authorizing Provider  baclofen (LIORESAL) 10 MG tablet Take 1 tablet (10 mg total) by mouth 3 (three) times daily. 09/15/19 09/14/20  Domnick Chervenak, Roselyn Bering, PA-C  meloxicam (MOBIC) 15 MG tablet Take 1 tablet (15 mg total) by mouth daily. 09/15/19 09/14/20  Faythe Ghee, PA-C    Allergies Patient has no known allergies.  Family History  Problem Relation Age of Onset  . Breast cancer Maternal Grandmother     Social History Social History   Tobacco Use  . Smoking status: Never Smoker  . Smokeless tobacco: Never Used  Substance Use Topics  . Alcohol use: No    Alcohol/week: 0.0 standard drinks  . Drug use: No    Comment: used approximately 1 month ago before she knew she was pregnant and none since.    Review of Systems  Constitutional: No fever/chills Eyes: No visual  changes. ENT: No sore throat. Respiratory: Denies cough Cardiovascular: Positive chest pain Gastrointestinal: Denies abdominal pain Genitourinary: Negative for dysuria. Musculoskeletal: Positive for back pain. Skin: Negative for rash. Psychiatric: no mood changes,     ____________________________________________   PHYSICAL EXAM:  VITAL SIGNS: ED Triage Vitals  Enc Vitals Group     BP 09/15/19 1125 (!) 142/97     Pulse Rate 09/15/19 1125 78     Resp 09/15/19 1125 18     Temp 09/15/19 1125 98.7 F (37.1 C)     Temp Source 09/15/19 1125 Oral     SpO2 09/15/19 1125 100 %     Weight 09/15/19 1126 129 lb (58.5 kg)     Height 09/15/19 1126 5\' 2"  (1.575 m)     Head Circumference --      Peak Flow --      Pain Score 09/15/19 1126 7     Pain Loc --      Pain Edu? --      Excl. in GC? --     Constitutional: Alert and oriented. Well appearing and in no acute distress. Eyes: Conjunctivae are normal.  Head: Atraumatic. Nose: No congestion/rhinnorhea. Mouth/Throat: Mucous membranes are moist.   Neck:  supple no lymphadenopathy noted Cardiovascular: Normal rate, regular rhythm. Heart sounds are normal Respiratory: Normal respiratory effort.  No retractions, lungs c t a  GU: deferred Musculoskeletal: FROM all  extremities, warm and well perfused, C-spine minimally tender, trapezius muscle spasm bilaterally, lumbar spine minimally tender, grips equal bilaterally Neurologic:  Normal speech and language.  Skin:  Skin is warm, dry and intact. No rash noted. Psychiatric: Mood and affect are normal. Speech and behavior are normal.  ____________________________________________   LABS (all labs ordered are listed, but only abnormal results are displayed)  Labs Reviewed  BASIC METABOLIC PANEL - Abnormal; Notable for the following components:      Result Value   BUN 22 (*)    All other components within normal limits  CBC - Abnormal; Notable for the following components:   RBC 5.15  (*)    Hemoglobin 11.8 (*)    MCV 71.1 (*)    MCH 22.9 (*)    All other components within normal limits  POC URINE PREG, ED  TROPONIN I (HIGH SENSITIVITY)   ____________________________________________   ____________________________________________  RADIOLOGY  Chest x-ray is normal except for scoliosis  ____________________________________________   PROCEDURES  Procedure(s) performed: No  Procedures    ____________________________________________   INITIAL IMPRESSION / ASSESSMENT AND PLAN / ED COURSE  Pertinent labs & imaging results that were available during my care of the patient were reviewed by me and considered in my medical decision making (see chart for details).   Patient is a 24 year old female presents emergency department with concerns of back pain and some chest pain.  See HPI  Physical exam patient appears very well.  Spasms noted in the back.  My exam is unremarkable  DX: Nonspecific chest pain, muscle spasms, scoliosis, cervical radiculopathy  CBC is basically normal, basic metabolic panel is normal, troponin is normal EKG shows normal sinus rhythm  Did explain the findings to the patient.  Told her her labs are reassuring that she does not have a cardiac event.  I do feel that she is having spasms and muscle type pain mostly due to her scoliosis.  She is placed on meloxicam and baclofen.  She is to follow-up with either orthopedics or chiropractor.  Apply ice to the spine.  Return if worsening.  She was also given a work note at her request.  She is discharged stable condition.    Tracy Moses was evaluated in Emergency Department on 09/15/2019 for the symptoms described in the history of present illness. She was evaluated in the context of the global COVID-19 pandemic, which necessitated consideration that the patient might be at risk for infection with the SARS-CoV-2 virus that causes COVID-19. Institutional protocols and algorithms that pertain  to the evaluation of patients at risk for COVID-19 are in a state of rapid change based on information released by regulatory bodies including the CDC and federal and state organizations. These policies and algorithms were followed during the patient's care in the ED.   As part of my medical decision making, I reviewed the following data within the electronic MEDICAL RECORD NUMBER Nursing notes reviewed and incorporated, Labs reviewed , EKG interpreted NSR, Old chart reviewed, Radiograph reviewed , Notes from prior ED visits and [place] Controlled Substance Database  ____________________________________________   FINAL CLINICAL IMPRESSION(S) / ED DIAGNOSES  Final diagnoses:  Acute exacerbation of chronic low back pain  Cervical radiculopathy      NEW MEDICATIONS STARTED DURING THIS VISIT:  New Prescriptions   BACLOFEN (LIORESAL) 10 MG TABLET    Take 1 tablet (10 mg total) by mouth 3 (three) times daily.   MELOXICAM (MOBIC) 15 MG TABLET    Take 1  tablet (15 mg total) by mouth daily.     Note:  This document was prepared using Dragon voice recognition software and may include unintentional dictation errors.    Versie Starks, PA-C 09/15/19 1311    Vanessa Avondale, MD 09/15/19 [phone]

## 2019-09-15 NOTE — ED Triage Notes (Signed)
Pt comes in for back pain r/t her recent scoliosis dx. Pt also states that the pain goes to her neck and either arm depending on her position. Upper left arm/chest pain today. Started 3 days ago.

## 2019-09-15 NOTE — ED Notes (Signed)
See triage note  Presents with back pain   States she has intermittent pain   States pain comes from neck into back and arms

## 2021-03-19 ENCOUNTER — Other Ambulatory Visit: Payer: Self-pay

## 2021-03-19 ENCOUNTER — Encounter: Payer: Self-pay | Admitting: Physician Assistant

## 2021-03-19 ENCOUNTER — Ambulatory Visit: Payer: Self-pay | Admitting: Physician Assistant

## 2021-03-19 DIAGNOSIS — B009 Herpesviral infection, unspecified: Secondary | ICD-10-CM

## 2021-03-19 DIAGNOSIS — Z113 Encounter for screening for infections with a predominantly sexual mode of transmission: Secondary | ICD-10-CM

## 2021-03-19 LAB — WET PREP FOR TRICH, YEAST, CLUE
Trichomonas Exam: NEGATIVE
Yeast Exam: NEGATIVE

## 2021-03-19 MED ORDER — ACYCLOVIR 800 MG PO TABS: 800.0000 mg | ORAL_TABLET | Freq: Three times a day (TID) | ORAL | 12 refills | Status: DC

## 2021-03-19 NOTE — Progress Notes (Signed)
  [facility] Department STI clinic/screening visit  Subjective:  Tracy Moses is a 25 y.o. female being seen today for an STI screening visit. The patient reports they do not have symptoms.  Patient reports that they do not desire a pregnancy in the next year.   They reported they are not interested in discussing contraception today.  Patient's last menstrual period was 03/05/2021 (approximate).   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  Medication refill    HPI  Patient reports that she is here for a screening and needs Rx renewal for episodic treatment of HSV.  States that she has only occasional outbreaks and is satisfied with the episodic treatment.  Denies chronic conditions, surgeries and regular medicines.  LMP was 03/05/2021 and using condoms as BCM.  States last HIV test was in 2020 and last pap was less than 3 years ago.   See flowsheet for further details and programmatic requirements.    The following portions of the patient's history were reviewed and updated as appropriate: allergies, current medications, past medical history, past social history, past surgical history and problem list.  Objective:  There were no vitals filed for this visit.  Physical Exam Constitutional:      General: She is not in acute distress.    Appearance: Normal appearance.  HENT:     Head: Normocephalic and atraumatic.  Eyes:     Conjunctiva/sclera: Conjunctivae normal.  Pulmonary:     Effort: Pulmonary effort is normal.  Skin:    General: Skin is warm and dry.  Neurological:     Mental Status: She is alert and oriented to person, place, and time.  Psychiatric:        Mood and Affect: Mood normal.        Behavior: Behavior normal.        Thought Content: Thought  content normal.        Judgment: Judgment normal.     Assessment and Plan:  Tracy Moses is a 25 y.o. female presenting to the [facility] Department for STI screening  1. Screening for STD (sexually transmitted disease) Patient into clinic without symptoms. Patient declines blood work today. Patient declines pelvic by provider and requests to self collect vaginal samples.  Reviewed with patient how to collect vaginal samples for accurate results. Rec condoms with all sex. Await test results.  Counseled that RN will call if needs to RTC for treatment once results are back.  - WET PREP FOR TRICH, YEAST, CLUE - Chlamydia/Gonorrhea [facility]  2. HSV-2 infection Rx for Acyclovir 800 mg #6 1 po TID for 2 days as needed with refills for 1 year sent to patient's pharmacy of choice per her request.  - acyclovir (ZOVIRAX) 800 MG tablet; Take 1 tablet (800 mg total) by mouth 3 (three) times daily. For 2 days as needed.  Dispense: 6 tablet; Refill: 12     No follow-ups on file.  No future appointments.  Matt Holmes, PA

## 2021-03-19 NOTE — Progress Notes (Signed)
Pt here for STD screening.  Wet mount results reviewed and no treatment required.  Pt declined condoms. Berdie Ogren, RN

## 2021-06-24 ENCOUNTER — Ambulatory Visit: Payer: 59

## 2021-07-04 ENCOUNTER — Ambulatory Visit: Payer: 59

## 2021-07-24 ENCOUNTER — Ambulatory Visit (LOCAL_COMMUNITY_HEALTH_CENTER): Payer: Self-pay

## 2021-07-24 VITALS — BP 113/70 | Ht 62.0 in | Wt 149.0 lb

## 2021-07-24 DIAGNOSIS — Z3201 Encounter for pregnancy test, result positive: Secondary | ICD-10-CM

## 2021-07-24 LAB — PREGNANCY, URINE: Preg Test, Ur: POSITIVE — AB

## 2021-07-24 MED ORDER — PRENATAL VITAMIN 27-0.8 MG PO TABS: 1.0000 | ORAL_TABLET | Freq: Every day | ORAL | 0 refills | Status: AC

## 2021-07-24 NOTE — Addendum Note (Signed)
Addended by: Burt Knack on: 07/24/2021 05:03 PM ? ? Modules accepted: Level of Service ? ?

## 2021-07-24 NOTE — Progress Notes (Signed)
Patient here with positive PT test. Unsure about pregnancy plans and counseled that she needs to start pregnancy care around 11 weeks pregnancy if she plans to continue with her pregnancy. Patient counseled to call for appointment if she wants to have maternity care here at [facility]. Marland KitchenJenetta Downer, RN  ?

## 2021-09-25 ENCOUNTER — Telehealth: Payer: Self-pay | Admitting: Family Medicine

## 2021-09-25 NOTE — Telephone Encounter (Signed)
Pt has not signed up as a prenatal pt yet but she has a general question about what medications can be taken during pregnancy.

## 2021-09-26 DIAGNOSIS — Z419 Encounter for procedure for purposes other than remedying health state, unspecified: Secondary | ICD-10-CM | POA: Diagnosis not present

## 2021-09-27 NOTE — Telephone Encounter (Addendum)
Returned call to patient who has questions about medication safety during pregnancy. Patient is 72 2/7 and states she has 2 acyclovir tablets left from 11/22 and wants to take them as she feels like she is having an outbreak. Patient states they are not expired. Per EPIC notes they were prescribed by C. Cisco, [place] with 12 refills in 02/2021 before current pregnancy. Patient has new OB visit with [facility] on 10/22/21 and states she waited to make her appointment until her Medicaid came through. Patient offered earlier appointment to start care at [facility]. Patient states she thinks she wants to deliver at [facility] and patient informed that she may have prenatal care at [facility] and deliver at [facility] if she chooses. Patient decided to schedule appointment here and given next available new OB appointment on 10/08/2021 and counseled to call [facility] to cancel that appointment if she is not going to keep it. Patient states she will call to cancel. Patient informed that this RN will consult with provider regarding safety of her current prescription for Acyclovir and then call her back again. Burt Knack, RN   Per Hazle Coca, CNM patient may take her acyclovir if needed and get refill if needed. If symptoms don't respond to treatment, patient should come in for STD visit. TC to patient, VM full. TC to patient's emergency contact and patient was available at that number. Patient counseled she can take her medication and should schedule appointment if medication doesn't help her symptoms. Patient states she doesn't actually have symptoms now, she just wanted to know whether she can take the acyclovir if needed. This RN apologized for misunderstanding. Patient states understanding and reminded of appointment on 10/08/21. Patient states she will go for U/S at "[facility]" tomorrow. Burt Knack, RN   Consulted on the plan of care for this client.  I agree with the documented note and actions taken to provide care for this client.  Hazle Coca, CNM

## 2021-10-08 ENCOUNTER — Ambulatory Visit: Payer: Medicaid Other | Admitting: Advanced Practice Midwife

## 2021-10-08 ENCOUNTER — Encounter: Payer: Self-pay | Admitting: Advanced Practice Midwife

## 2021-10-08 VITALS — BP 114/64 | HR 81 | Temp 97.8°F | Wt 151.2 lb

## 2021-10-08 DIAGNOSIS — Z3480 Encounter for supervision of other normal pregnancy, unspecified trimester: Secondary | ICD-10-CM | POA: Insufficient documentation

## 2021-10-08 DIAGNOSIS — O0930 Supervision of pregnancy with insufficient antenatal care, unspecified trimester: Secondary | ICD-10-CM

## 2021-10-08 DIAGNOSIS — O99019 Anemia complicating pregnancy, unspecified trimester: Secondary | ICD-10-CM | POA: Insufficient documentation

## 2021-10-08 DIAGNOSIS — Z3482 Encounter for supervision of other normal pregnancy, second trimester: Secondary | ICD-10-CM

## 2021-10-08 DIAGNOSIS — Z2839 Other underimmunization status: Secondary | ICD-10-CM | POA: Insufficient documentation

## 2021-10-08 DIAGNOSIS — O99012 Anemia complicating pregnancy, second trimester: Secondary | ICD-10-CM | POA: Diagnosis not present

## 2021-10-08 DIAGNOSIS — A6000 Herpesviral infection of urogenital system, unspecified: Secondary | ICD-10-CM

## 2021-10-08 DIAGNOSIS — O0932 Supervision of pregnancy with insufficient antenatal care, second trimester: Secondary | ICD-10-CM

## 2021-10-08 DIAGNOSIS — A599 Trichomoniasis, unspecified: Secondary | ICD-10-CM

## 2021-10-08 LAB — URINALYSIS
Bilirubin, UA: NEGATIVE
Glucose, UA: NEGATIVE
Ketones, UA: NEGATIVE
Nitrite, UA: NEGATIVE
Protein,UA: NEGATIVE
RBC, UA: NEGATIVE
Specific Gravity, UA: 1.025 (ref 1.005–1.030)
Urobilinogen, Ur: 1 mg/dL (ref 0.2–1.0)
pH, UA: 7 (ref 5.0–7.5)

## 2021-10-08 LAB — WET PREP FOR TRICH, YEAST, CLUE
Trichomonas Exam: POSITIVE — AB
Yeast Exam: NEGATIVE

## 2021-10-08 LAB — HEMOGLOBIN, FINGERSTICK: Hemoglobin: 9.9 g/dL — ABNORMAL LOW (ref 11.1–15.9)

## 2021-10-08 MED ORDER — METRONIDAZOLE 500 MG PO TABS: 500.0000 mg | ORAL_TABLET | Freq: Two times a day (BID) | ORAL | 0 refills | Status: AC

## 2021-10-08 MED ORDER — IRON (FERROUS SULFATE) 325 (65 FE) MG PO TABS: 1.0000 | ORAL_TABLET | Freq: Two times a day (BID) | ORAL | 0 refills | Status: DC

## 2021-10-08 NOTE — Progress Notes (Signed)
Here today for 16.4 week MH IP. Taking PNV QD. Denies ED/hospital visits since +PT. Declines Flu vaccine today. Tawny Hopping, RN

## 2021-10-08 NOTE — Progress Notes (Signed)
[facility] Health Department  Maternal Health Clinic   INITIAL PRENATAL VISIT NOTE  Subjective:  Tracy Moses is a 26 y.o. SBF exvaper G3P2002 (8,5) at [redacted]w[redacted]d being seen today to start prenatal care at the [facility] Department. She feels "good" about surprise pregnancy with no birth control. 26 yo employed FOB feels "good" about pregnancy; in supportive 1 year relationship; he has 4 kids by other women and pt doesn't know how old they are but they live with their mothers. She works 40 hrs/wk at [employer] and lives with her mom and her 2 kids.  She has had 2 u/s this pregnancy at [facility] in [place] with no dating. Has not been to ER this pregnancy. LMP 06/14/21. Last vaped 2021. Last cigar 2019. Last MJ 2022. Last ETOH 04/2021 (3shots liquor qo weekend). Denies cigs. Last dental exam 2019. Had NOB apt with [facility] on 10/22/21 also. She is currently monitored for the following issues for this low-risk pregnancy and has Marijuana use; Genital herpes; Prenatal care, subsequent pregnancy, second trimester; and Late prenatal care 16 4/7 wks on their problem list.  Patient reports no complaints.  Contractions: Not present. Vag. Bleeding: None.  Movement: Present. Denies leaking of fluid.   Indications for ASA therapy (per uptodate) One of the following: Previous pregnancy with preeclampsia, especially early onset and with an adverse outcome No Multifetal gestation No Chronic hypertension No Type 1 or 2 diabetes mellitus No Chronic kidney disease No Autoimmune disease (antiphospholipid syndrome, systemic lupus erythematosus) No  Two or more of the following: Nulliparity No Obesity (body mass index >30 kg/m2) No Family history of preeclampsia in mother or sister No Age =35 years No Sociodemographic characteristics (African American race, low socioeconomic level) No Personal risk factors (eg, previous pregnancy with low birth weight or small for gestational age  infant, previous adverse pregnancy outcome [eg, stillbirth], interval >10 years between pregnancies) No   The following portions of the patient's history were reviewed and updated as appropriate: allergies, current medications, past family history, past medical history, past social history, past surgical history and problem list. Problem list updated.  Objective:   Vitals:   10/08/21 0836  BP: 114/64  Pulse: 81  Temp: 97.8 F (36.6 C)  Weight: 151 lb 3.2 oz (68.6 kg)    Fetal Status: Fetal Heart Rate (bpm): 150 Fundal Height: 19 cm Movement: Present  Presentation: Undeterminable   Physical Exam Vitals and nursing note reviewed.  Constitutional:      General: She is not in acute distress.    Appearance: Normal appearance. She is well-developed.  HENT:     Head: Normocephalic and atraumatic.     Right Ear: External ear normal.     Left Ear: External ear normal.     Nose: Nose normal. No congestion or rhinorrhea.     Mouth/Throat:     Lips: Pink.     Mouth: Mucous membranes are moist.     Dentition: Normal dentition. No dental caries.     Pharynx: Oropharynx is clear. Uvula midline.     Comments: Dentition: good; last dental exam 2019 Eyes:     General: No scleral icterus.    Conjunctiva/sclera: Conjunctivae normal.  Neck:     Thyroid: No thyroid mass, thyromegaly or thyroid tenderness.  Cardiovascular:     Rate and Rhythm: Normal rate.     Pulses: Normal pulses.     Comments: Extremities are warm and well perfused Pulmonary:     Effort: Pulmonary  effort is normal.     Breath sounds: Normal breath sounds.  Chest:     Chest wall: No mass.  Breasts:    Tanner Score is 5.     Breasts are symmetrical.     Right: Normal. No mass, nipple discharge or skin change.     Left: Normal. No mass, nipple discharge or skin change.  Abdominal:     Palpations: Abdomen is soft.     Tenderness: There is no abdominal tenderness.     Comments: Gravid, soft without masses or  tenderness, FHR=150, FH=19-20 wks size  Genitourinary:    General: Normal vulva.     Exam position: Lithotomy position.     Pubic Area: No rash.      Labia:        Right: No rash.        Left: No rash.      Vagina: Vaginal discharge (white creamy leukorrhea, ph<4.5) present.     Cervix: Normal.     Uterus: Normal. Enlarged (Gravid 19-20 wks size). Not tender.      Rectum: Normal. No external hemorrhoid.     Comments: Pap done Musculoskeletal:     Right lower leg: No edema.     Left lower leg: No edema.  Lymphadenopathy:     Cervical: No cervical adenopathy.     Upper Body:     Right upper body: No axillary adenopathy.     Left upper body: No axillary adenopathy.  Skin:    General: Skin is warm.     Capillary Refill: Capillary refill takes less than 2 seconds.  Neurological:     Mental Status: She is alert.    Assessment and Plan:  Pregnancy: G3P2002 at [redacted]w[redacted]d  1. Prenatal care, subsequent pregnancy, second trimester Please give pt dental list as well as [facility] # for her kids Counseled on weight gain of 15-25 lbs this pregnancy Dating u/s ordered Pt refuses all genetic screening  - Hemoglobinopathy evaluation -248250 - HIV-1/HIV-2 Qualitative RNA - HCV Ab w Reflex to Quant PCR - Prenatal Profile with Varicella(282020) - Urine Culture - Chlamydia/GC NAA, Confirmation - Hgb A1c w/o eAG - 037048 Drug Screen - IGP, rfx Aptima HPV ASCU - WET PREP FOR TRICH, YEAST, CLUE - Urinalysis (Urine Dip) - Hemoglobin, venipuncture  2. Late prenatal care 16 4/7 wks     Discussed overview of care and coordination with inpatient delivery practices including [facility], [facility], [facility] and [facility].   Reviewed Centering pregnancy as standard of care at [facility]   Preterm labor symptoms and general obstetric precautions including but not limited to vaginal bleeding, contractions, leaking of fluid and fetal movement were reviewed in detail with the  patient.  Please refer to After Visit Summary for other counseling recommendations.   Return in about 4 weeks (around 11/05/2021) for routine PNC.  Future Appointments  Date Time Provider Department Center  10/22/2021  2:30 PM Linzie Collin, MD [facility] None    Alberteen Spindle, CNM

## 2021-10-08 NOTE — Progress Notes (Signed)
Hgb, wet mount and UA all reviewed while patient in clinic. Patient treated for anemia and Trich per standing orders. Tawny Hopping, RN

## 2021-10-09 LAB — FE+CBC/D/PLT+TIBC+FER+RETIC
Basophils Absolute: 0.1 10*3/uL (ref 0.0–0.2)
Basos: 1 %
EOS (ABSOLUTE): 0.1 10*3/uL (ref 0.0–0.4)
Eos: 1 %
Ferritin: 47 ng/mL (ref 15–150)
Hematocrit: 31.5 % — ABNORMAL LOW (ref 34.0–46.6)
Hemoglobin: 10 g/dL — ABNORMAL LOW (ref 11.1–15.9)
Immature Grans (Abs): 0 10*3/uL (ref 0.0–0.1)
Immature Granulocytes: 0 %
Iron Saturation: 45 % (ref 15–55)
Iron: 159 ug/dL (ref 27–159)
Lymphocytes Absolute: 1.5 10*3/uL (ref 0.7–3.1)
Lymphs: 15 %
MCH: 22.8 pg — ABNORMAL LOW (ref 26.6–33.0)
MCHC: 31.7 g/dL (ref 31.5–35.7)
MCV: 72 fL — ABNORMAL LOW (ref 79–97)
Monocytes Absolute: 0.4 10*3/uL (ref 0.1–0.9)
Monocytes: 4 %
Neutrophils Absolute: 7.9 10*3/uL — ABNORMAL HIGH (ref 1.4–7.0)
Neutrophils: 79 %
Platelets: 264 10*3/uL (ref 150–450)
RBC: 4.38 x10E6/uL (ref 3.77–5.28)
RDW: 15.4 % (ref 11.7–15.4)
Retic Ct Pct: 2.5 % (ref 0.6–2.6)
Total Iron Binding Capacity: 353 ug/dL (ref 250–450)
UIBC: 194 ug/dL (ref 131–425)
WBC: 10 10*3/uL (ref 3.4–10.8)

## 2021-10-10 LAB — 789231 7+OXYCODONE-BUND
Amphetamines, Urine: NEGATIVE ng/mL
BENZODIAZ UR QL: NEGATIVE ng/mL
Barbiturate screen, urine: NEGATIVE ng/mL
Cannabinoid Quant, Ur: NEGATIVE ng/mL
Cocaine (Metab.): NEGATIVE ng/mL
OPIATE SCREEN URINE: NEGATIVE ng/mL
Oxycodone/Oxymorphone, Urine: NEGATIVE ng/mL
PCP Quant, Ur: NEGATIVE ng/mL

## 2021-10-10 LAB — CHLAMYDIA/GC NAA, CONFIRMATION
Chlamydia trachomatis, NAA: NEGATIVE
Neisseria gonorrhoeae, NAA: NEGATIVE

## 2021-10-10 LAB — URINE CULTURE

## 2021-10-10 NOTE — Progress Notes (Signed)
Lab results reviewed: UDS neg (prob list updated); U C&S 25-50k MUF, no action indicated.

## 2021-10-11 ENCOUNTER — Telehealth: Payer: Self-pay | Admitting: Family Medicine

## 2021-10-11 ENCOUNTER — Other Ambulatory Visit: Payer: Self-pay | Admitting: Nurse Practitioner

## 2021-10-11 DIAGNOSIS — A6000 Herpesviral infection of urogenital system, unspecified: Secondary | ICD-10-CM

## 2021-10-11 LAB — CBC/D/PLT+RPR+RH+ABO+RUB AB...
Antibody Screen: NEGATIVE
Basophils Absolute: 0.1 10*3/uL (ref 0.0–0.2)
Basos: 1 %
EOS (ABSOLUTE): 0 10*3/uL (ref 0.0–0.4)
Eos: 0 %
Hematocrit: 30.7 % — ABNORMAL LOW (ref 34.0–46.6)
Hemoglobin: 10 g/dL — ABNORMAL LOW (ref 11.1–15.9)
Hepatitis B Surface Ag: NEGATIVE
Immature Grans (Abs): 0 10*3/uL (ref 0.0–0.1)
Immature Granulocytes: 0 %
Lymphocytes Absolute: 1.5 10*3/uL (ref 0.7–3.1)
Lymphs: 15 %
MCH: 23 pg — ABNORMAL LOW (ref 26.6–33.0)
MCHC: 32.6 g/dL (ref 31.5–35.7)
MCV: 71 fL — ABNORMAL LOW (ref 79–97)
Monocytes Absolute: 0.4 10*3/uL (ref 0.1–0.9)
Monocytes: 4 %
Neutrophils Absolute: 8 10*3/uL — ABNORMAL HIGH (ref 1.4–7.0)
Neutrophils: 80 %
Platelets: 286 10*3/uL (ref 150–450)
RBC: 4.34 x10E6/uL (ref 3.77–5.28)
RDW: 15.5 % — ABNORMAL HIGH (ref 11.7–15.4)
RPR Ser Ql: REACTIVE — AB
Rh Factor: POSITIVE
Rubella Antibodies, IGG: 1.54 index (ref >0.99)
Varicella zoster IgG: <135 index — ABNORMAL LOW (ref >165)
WBC: 10.1 10*3/uL (ref 3.4–10.8)

## 2021-10-11 LAB — HGB A1C W/O EAG: Hgb A1c MFr Bld: 5.1 % (ref 4.8–5.6)

## 2021-10-11 LAB — HGB FRACTIONATION CASCADE
Hgb A2: 5.6 % — ABNORMAL HIGH (ref 1.8–3.2)
Hgb A: 91.4 % — ABNORMAL LOW (ref 96.4–98.8)
Hgb F: 3 % — ABNORMAL HIGH (ref 0.0–2.0)
Hgb S: 0 %

## 2021-10-11 LAB — HIV-1/HIV-2 QUALITATIVE RNA
HIV-1 RNA, Qualitative: NONREACTIVE
HIV-2 RNA, Qualitative: NONREACTIVE

## 2021-10-11 LAB — RPR, QUANT+TP ABS (REFLEX)
Rapid Plasma Reagin, Quant: 1:1 {titer} — ABNORMAL HIGH
T Pallidum Abs: NONREACTIVE

## 2021-10-11 LAB — HCV AB W REFLEX TO QUANT PCR: HCV Ab: NONREACTIVE

## 2021-10-11 LAB — HCV INTERPRETATION

## 2021-10-11 MED ORDER — ACYCLOVIR 800 MG PO TABS: 800.0000 mg | ORAL_TABLET | Freq: Two times a day (BID) | ORAL | 0 refills | Status: AC

## 2021-10-11 NOTE — Telephone Encounter (Signed)
Telephone call to patient today regarding her concern about a blister on her labia that is forming.  She reports it started yesterday.   Telephone call on the other line to Katie, the maternity Coordinator to inquire about a Rx being called inversus an OV.  Katie consulted with Glenna Fellows, NP and Acyclovir 400 mg TID x 5 days will be called into patient pharmacy today and patient will be counseled at next OV about suppressive therapy (note put on pink sticky note).  Patient was given above instructions and she verbalized understanding.  Hart Carwin, RN

## 2021-10-11 NOTE — Telephone Encounter (Signed)
Please call me I have questions 

## 2021-10-11 NOTE — Progress Notes (Unsigned)
Telephone call from 26 year old female [redacted]w[redacted]d stating that she has a history of Herpes and feels like she is having another outbreak.  Patient states was on herpes therapy medication prior to pregnancy and only has 2 pills left.  Prescription sent to pharmacy for Acyclovir 800 MG PO BID x 5 days.  At next revisit will discuss suppressive therapy.  Glenna Fellows, FNP

## 2021-10-12 LAB — IGP, RFX APTIMA HPV ASCU: PAP Smear Comment: 0

## 2021-10-15 ENCOUNTER — Encounter: Payer: Self-pay | Admitting: Advanced Practice Midwife

## 2021-10-15 DIAGNOSIS — D563 Thalassemia minor: Secondary | ICD-10-CM | POA: Insufficient documentation

## 2021-10-15 DIAGNOSIS — Z9289 Personal history of other medical treatment: Secondary | ICD-10-CM | POA: Insufficient documentation

## 2021-10-17 NOTE — Addendum Note (Signed)
Addended by: Heywood Bene on: 10/17/2021 02:30 PM   Modules accepted: Orders

## 2021-10-22 ENCOUNTER — Encounter: Payer: Self-pay | Admitting: Obstetrics and Gynecology

## 2021-10-26 DIAGNOSIS — Z419 Encounter for procedure for purposes other than remedying health state, unspecified: Secondary | ICD-10-CM | POA: Diagnosis not present

## 2021-11-05 ENCOUNTER — Ambulatory Visit: Payer: Medicaid Other | Admitting: Advanced Practice Midwife

## 2021-11-05 VITALS — BP 103/56 | HR 91 | Temp 98.3°F | Wt 157.2 lb

## 2021-11-05 DIAGNOSIS — O0932 Supervision of pregnancy with insufficient antenatal care, second trimester: Secondary | ICD-10-CM

## 2021-11-05 DIAGNOSIS — O99012 Anemia complicating pregnancy, second trimester: Secondary | ICD-10-CM

## 2021-11-05 DIAGNOSIS — O0930 Supervision of pregnancy with insufficient antenatal care, unspecified trimester: Secondary | ICD-10-CM

## 2021-11-05 DIAGNOSIS — F1290 Cannabis use, unspecified, uncomplicated: Secondary | ICD-10-CM

## 2021-11-05 DIAGNOSIS — A6000 Herpesviral infection of urogenital system, unspecified: Secondary | ICD-10-CM

## 2021-11-05 DIAGNOSIS — Z3482 Encounter for supervision of other normal pregnancy, second trimester: Secondary | ICD-10-CM

## 2021-11-05 LAB — HEMOGLOBIN, FINGERSTICK: Hemoglobin: 9.3 g/dL — ABNORMAL LOW (ref 11.1–15.9)

## 2021-11-05 NOTE — Progress Notes (Signed)
[facility] Health Department Maternal Health Clinic  PRENATAL VISIT NOTE  Subjective:  Tracy Moses is a 26 y.o. G3P2002 at [redacted]w[redacted]d being seen today for ongoing prenatal care.  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  Patient reports no complaints.  Contractions: Not present. Vag. Bleeding: None.  Movement: Present. Denies leaking of fluid/ROM.   The following portions of the patient's history were reviewed and updated as appropriate: allergies, current medications, past family history, past medical history, past social history, past surgical history and problem list. Problem list updated.  Objective:   Vitals:   11/05/21 1005  BP: (!) 103/56  Pulse: 91  Temp: 98.3 F (36.8 C)  Weight: 157 lb 3.2 oz (71.3 kg)    Fetal Status: Fetal Heart Rate (bpm): 130 Fundal Height: 20 cm Movement: Present     General:  Alert, oriented and cooperative. Patient is in no acute distress.  Skin: Skin is warm and dry. No rash noted.   Cardiovascular: Normal heart rate noted  Respiratory: Normal respiratory effort, no problems with respiration noted  Abdomen: Soft, gravid, appropriate for gestational age.  Pain/Pressure: Absent     Pelvic: Cervical exam deferred        Extremities: Normal range of motion.  Edema: None  Mental Status: Normal mood and affect. Normal behavior. Normal judgment and thought content.   Assessment and Plan:  Pregnancy: G3P2002 at [redacted]w[redacted]d  1. Anemia affecting pregnancy in second trimester Noncompliant with FeSo4 and taking only 3-4x/wk with water;counseled to take daily with juice Denies ice pica - Hemoglobin,  fingerstick  2. Prenatal care, subsequent pregnancy, second trimester 28 lb 3.2 oz (7.802 kg) Has first dating u/s 11/13/21 Working 40-50 hrs/wk Not exercising--encouraged to do so Has had 2 u/s at [facility] in [place] but no dating u/s  3. Marijuana use Denies use  4. Genital herpes simplex, unspecified site Counseled will need suppressive therapy at 36 wks until birth  5. Late prenatal care 28 4/7 wks    Preterm labor symptoms and general obstetric precautions including but not limited to vaginal bleeding, contractions, leaking of fluid and fetal movement were reviewed in detail with the patient. Please refer to After Visit Summary for other counseling recommendations.  Return in about 4 weeks (around 12/03/2021) for routine PNC.  No future appointments.  Alberteen Spindle, CNM

## 2021-11-05 NOTE — Progress Notes (Signed)
Patient here for MH RV at 20w 4d.   Patient declined genetic testing.   [facility] contact card x2 given today.   Hgb results reviewed during clinic visit - patient is educated to take iron TID instead of daily. Informed patient to take iron x1 for breakfast,lunch, and dinner. Informed patient to take with juice such as apple, orange, or pineapple juice. Patient verbalized understanding.    Earlyne Iba, RN

## 2021-11-13 DIAGNOSIS — Z3689 Encounter for other specified antenatal screening: Secondary | ICD-10-CM | POA: Diagnosis not present

## 2021-11-13 DIAGNOSIS — Z3A21 21 weeks gestation of pregnancy: Secondary | ICD-10-CM | POA: Diagnosis not present

## 2021-11-14 ENCOUNTER — Encounter: Payer: Self-pay | Admitting: Family Medicine

## 2021-11-26 DIAGNOSIS — Z419 Encounter for procedure for purposes other than remedying health state, unspecified: Secondary | ICD-10-CM | POA: Diagnosis not present

## 2021-12-03 ENCOUNTER — Ambulatory Visit: Payer: Medicaid Other | Admitting: Advanced Practice Midwife

## 2021-12-03 VITALS — BP 115/62 | HR 91 | Temp 97.8°F | Wt 163.4 lb

## 2021-12-03 DIAGNOSIS — F1290 Cannabis use, unspecified, uncomplicated: Secondary | ICD-10-CM

## 2021-12-03 DIAGNOSIS — Z3482 Encounter for supervision of other normal pregnancy, second trimester: Secondary | ICD-10-CM

## 2021-12-03 DIAGNOSIS — Z3480 Encounter for supervision of other normal pregnancy, unspecified trimester: Secondary | ICD-10-CM

## 2021-12-03 DIAGNOSIS — O0930 Supervision of pregnancy with insufficient antenatal care, unspecified trimester: Secondary | ICD-10-CM

## 2021-12-03 DIAGNOSIS — O99012 Anemia complicating pregnancy, second trimester: Secondary | ICD-10-CM

## 2021-12-03 DIAGNOSIS — O0932 Supervision of pregnancy with insufficient antenatal care, second trimester: Secondary | ICD-10-CM

## 2021-12-03 DIAGNOSIS — A6000 Herpesviral infection of urogenital system, unspecified: Secondary | ICD-10-CM

## 2021-12-03 LAB — URINALYSIS
Bilirubin, UA: NEGATIVE
Glucose, UA: NEGATIVE
Ketones, UA: NEGATIVE
Nitrite, UA: NEGATIVE
RBC, UA: NEGATIVE
Specific Gravity, UA: 1.03 (ref 1.005–1.030)
Urobilinogen, Ur: 2 mg/dL — ABNORMAL HIGH (ref 0.2–1.0)
pH, UA: 7 (ref 5.0–7.5)

## 2021-12-03 LAB — HEMOGLOBIN, FINGERSTICK: Hemoglobin: 9.6 g/dL — ABNORMAL LOW (ref 11.1–15.9)

## 2021-12-03 MED ORDER — IRON (FERROUS SULFATE) 325 (65 FE) MG PO TABS: 1.0000 | ORAL_TABLET | Freq: Three times a day (TID) | ORAL | 0 refills | Status: DC

## 2021-12-03 NOTE — Progress Notes (Signed)
Reviewed urine dip and hgb in clinic with provider. Urine culture added to orders per provider VO. Patient Hgb 9.6 today, patient counseled to continue taking iron 3x/day with vit C rich juice, patient given more iron today as she states she won't have enough until her next visit.Burt Knack, RN

## 2021-12-03 NOTE — Addendum Note (Signed)
Addended by: Burt Knack on: 12/03/2021 11:09 AM   Modules accepted: Orders

## 2021-12-03 NOTE — Progress Notes (Signed)
[facility] Department Maternal Health Clinic  PRENATAL VISIT NOTE  Subjective:  Tracy Moses is a 26 y.o. G3P2002 at [redacted]w[redacted]d being seen today for ongoing prenatal care.  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  Patient reports no complaints.  Contractions: Not present. Vag. Bleeding: None.  Movement: Present. Denies leaking of fluid/ROM.   The following portions of the patient's history were reviewed and updated as appropriate: allergies, current medications, past family history, past medical history, past social history, past surgical history and problem list. Problem list updated.  Objective:   Vitals:   12/03/21 1008  BP: 115/62  Pulse: 91  Temp: 97.8 F (36.6 C)  Weight: 163 lb 6.4 oz (74.1 kg)    Fetal Status: Fetal Heart Rate (bpm): 135 Fundal Height: 27 cm Movement: Present     General:  Alert, oriented and cooperative. Patient is in no acute distress.  Skin: Skin is warm and dry. No rash noted.   Cardiovascular: Normal heart rate noted  Respiratory: Normal respiratory effort, no problems with respiration noted  Abdomen: Soft, gravid, appropriate for gestational age.  Pain/Pressure: Absent     Pelvic: Cervical exam deferred        Extremities: Normal range of motion.  Edema: None  Mental Status: Normal mood and affect. Normal behavior. Normal judgment and thought content.   Assessment and Plan:  Pregnancy: G3P2002 at [redacted]w[redacted]d  1. Anemia affecting pregnancy in second trimester Taking FeSo4 TID with pineapple juice - Hemoglobin, fingerstick  2. Supervision of other normal pregnancy, antepartum Working 45  hrs/wk Walking the track 1-2x/wk x 30 min 23 lb 6.4 oz (10.6 kg) Reviewed 11/13/21 u/s at 22 3/7 with left simple ovarian cyst, EFW=55%, 3VC, posterior placenta, anatomy wnl; this is first u/s - Urinalysis (Urine Dip)  3. Late prenatal care 16 4/7 wks   4. Marijuana use Agrees to UDS - 161096 Drug Screen  5. Genital herpes simplex, unspecified site Will need suppressive therapy 36 wks until birth   Preterm labor symptoms and general obstetric precautions including but not limited to vaginal bleeding, contractions, leaking of fluid and fetal movement were reviewed in detail with the patient. Please refer to After Visit Summary for other counseling recommendations.  Return in about 4 weeks (around 12/31/2021) for 28 week labs, routine PNC.  No future appointments.  Alberteen Spindle, CNM

## 2021-12-04 LAB — 789231 7+OXYCODONE-BUND
Amphetamines, Urine: NEGATIVE ng/mL
BENZODIAZ UR QL: NEGATIVE ng/mL
Barbiturate screen, urine: NEGATIVE ng/mL
Cannabinoid Quant, Ur: NEGATIVE ng/mL
Cocaine (Metab.): NEGATIVE ng/mL
OPIATE SCREEN URINE: NEGATIVE ng/mL
Oxycodone/Oxymorphone, Urine: NEGATIVE ng/mL
PCP Quant, Ur: NEGATIVE ng/mL

## 2021-12-05 LAB — URINE CULTURE

## 2021-12-24 ENCOUNTER — Ambulatory Visit: Payer: Medicaid Other | Admitting: Advanced Practice Midwife

## 2021-12-24 VITALS — BP 115/61 | HR 86 | Temp 97.4°F | Wt 167.0 lb

## 2021-12-24 DIAGNOSIS — O99013 Anemia complicating pregnancy, third trimester: Secondary | ICD-10-CM

## 2021-12-24 DIAGNOSIS — Z3483 Encounter for supervision of other normal pregnancy, third trimester: Secondary | ICD-10-CM | POA: Diagnosis not present

## 2021-12-24 DIAGNOSIS — A599 Trichomoniasis, unspecified: Secondary | ICD-10-CM

## 2021-12-24 DIAGNOSIS — O0933 Supervision of pregnancy with insufficient antenatal care, third trimester: Secondary | ICD-10-CM

## 2021-12-24 DIAGNOSIS — Z3482 Encounter for supervision of other normal pregnancy, second trimester: Secondary | ICD-10-CM

## 2021-12-24 DIAGNOSIS — O0930 Supervision of pregnancy with insufficient antenatal care, unspecified trimester: Secondary | ICD-10-CM

## 2021-12-24 DIAGNOSIS — F1290 Cannabis use, unspecified, uncomplicated: Secondary | ICD-10-CM

## 2021-12-24 DIAGNOSIS — O99012 Anemia complicating pregnancy, second trimester: Secondary | ICD-10-CM

## 2021-12-24 DIAGNOSIS — Z3480 Encounter for supervision of other normal pregnancy, unspecified trimester: Secondary | ICD-10-CM

## 2021-12-24 NOTE — Progress Notes (Signed)
[facility] Department Maternal Health Clinic  PRENATAL VISIT NOTE  Subjective:  Tracy Moses is a 26 y.o. G3P2002 at [redacted]w[redacted]d being seen today for ongoing prenatal care.  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  Patient reports no complaints.   .  .   . Denies leaking of fluid/ROM.   The following portions of the patient's history were reviewed and updated as appropriate: allergies, current medications, past family history, past medical history, past social history, past surgical history and problem list. Problem list updated.  Objective:   Vitals:   12/24/21 0859  BP: 115/61  Pulse: 86  Temp: (!) 97.4 F (36.3 C)  Weight: 167 lb (75.8 kg)    Fetal Status:   Fundal Height: 29 cm       General:  Alert, oriented and cooperative. Patient is in no acute distress.  Skin: Skin is warm and dry. No rash noted.   Cardiovascular: Normal heart rate noted  Respiratory: Normal respiratory effort, no problems with respiration noted  Abdomen: Soft, gravid, appropriate for gestational age.        Pelvic: Cervical exam deferred        Extremities: Normal range of motion.  Edema: None  Mental Status: Normal mood and affect. Normal behavior. Normal judgment and thought content.   Assessment and Plan:  Pregnancy: G3P2002 at [redacted]w[redacted]d  1. Prenatal care, subsequent pregnancy, second trimester 27 lb (12.2 kg) Working 45 hrs/wk Walking the track 2x/wk x 30 min 1 hour glucola today Has car seat  - Hemoglobin, venipuncture - Glucose, 1 hour - HIV-1/HIV-2 Qualitative RNA - RPR   3. Anemia affecting pregnancy in second trimester Taking  FeSo4 BID-TID with apple juice Hgb 12/03/21=9.6 [facility] hematology referral made for IV iron infusion  4. Late prenatal care 16 4/7 wks   5. Marijuana use Denies use; last UDS neg on 12/03/21  6. Trichomoniasis 10/08/21 Needs TOC 01/08/22   Preterm labor symptoms and general obstetric precautions including but not limited to vaginal bleeding, contractions, leaking of fluid and fetal movement were reviewed in detail with the patient. Please refer to After Visit Summary for other counseling recommendations.  Return in about 2 weeks (around 01/07/2022) for routine PNC.  No future appointments.  Alberteen Spindle, CNM

## 2021-12-24 NOTE — Progress Notes (Addendum)
28 week labs today. Counseled on recommendation for Tdap in pregnancy and for those who will spend any amt of time with infant. Client declined Tdap today. Counseled on importance of selecting a pediatrician for infant and resource list given to her today. Correctly verbalizes how to take iron and PNV. Taking iron tablet with juice. Reports missing one iron tablet during the day only a few times. Jossie Ng, RN Hgb = 9.8 and to continue PNV QD and iron supplement TID. [facility] referral faxed with snapshot pages and confirmation received. Client aware [facility] will call her with appt. Jossie Ng, RN

## 2021-12-25 LAB — HIV-1/HIV-2 QUALITATIVE RNA
HIV-1 RNA, Qualitative: NONREACTIVE
HIV-2 RNA, Qualitative: NONREACTIVE

## 2021-12-25 LAB — GLUCOSE, 1 HOUR GESTATIONAL: Gestational Diabetes Screen: 110 mg/dL (ref 70–139)

## 2021-12-25 LAB — HEMOGLOBIN, FINGERSTICK: Hemoglobin: 9.8 g/dL — ABNORMAL LOW (ref 11.1–15.9)

## 2021-12-25 LAB — RPR: RPR Ser Ql: NONREACTIVE

## 2021-12-27 DIAGNOSIS — Z419 Encounter for procedure for purposes other than remedying health state, unspecified: Secondary | ICD-10-CM | POA: Diagnosis not present

## 2022-01-02 DIAGNOSIS — D649 Anemia, unspecified: Secondary | ICD-10-CM | POA: Diagnosis not present

## 2022-01-07 ENCOUNTER — Ambulatory Visit: Payer: Medicaid Other | Admitting: Advanced Practice Midwife

## 2022-01-07 VITALS — BP 120/72 | HR 92 | Temp 98.4°F | Wt 171.8 lb

## 2022-01-07 DIAGNOSIS — A599 Trichomoniasis, unspecified: Secondary | ICD-10-CM

## 2022-01-07 DIAGNOSIS — O99012 Anemia complicating pregnancy, second trimester: Secondary | ICD-10-CM

## 2022-01-07 DIAGNOSIS — Z3480 Encounter for supervision of other normal pregnancy, unspecified trimester: Secondary | ICD-10-CM

## 2022-01-07 DIAGNOSIS — Z9289 Personal history of other medical treatment: Secondary | ICD-10-CM

## 2022-01-07 DIAGNOSIS — O0930 Supervision of pregnancy with insufficient antenatal care, unspecified trimester: Secondary | ICD-10-CM

## 2022-01-07 LAB — WET PREP FOR TRICH, YEAST, CLUE
Trichomonas Exam: NEGATIVE
Yeast Exam: NEGATIVE

## 2022-01-07 NOTE — Progress Notes (Signed)
[facility] Health Department Maternal Health Clinic  PRENATAL VISIT NOTE  Subjective:  Tracy Moses is a 26 y.o. G3P2002 at [redacted]w[redacted]d being seen today for ongoing prenatal care.  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  Patient reports no complaints.  Contractions: Not present. Vag. Bleeding: None.  Movement: Present. Denies leaking of fluid/ROM.   The following portions of the patient's history were reviewed and updated as appropriate: allergies, current medications, past family history, past medical history, past social history, past surgical history and problem list. Problem list updated.  Objective:   Vitals:   01/07/22 1023  BP: 120/72  Pulse: 92  Temp: 98.4 F (36.9 C)  Weight: 171 lb 12.8 oz (77.9 kg)    Fetal Status: Fetal Heart Rate (bpm): 140 Fundal Height: 31 cm Movement: Present     General:  Alert, oriented and cooperative. Patient is in no acute distress.  Skin: Skin is warm and dry. No rash noted.   Cardiovascular: Normal heart rate noted  Respiratory: Normal respiratory effort, no problems with respiration noted  Abdomen: Soft, gravid, appropriate for gestational age.  Pain/Pressure: Absent     Pelvic: Cervical exam deferred        Extremities: Normal range of motion.  Edema: None  Mental Status: Normal mood and affect. Normal behavior. Normal judgment and thought content.   Assessment and Plan:  Pregnancy: G3P2002 at [redacted]w[redacted]d  1. Trichomoniasis 10/08/21 TOC with wet mount today Treated 10/08/21 - WET PREP FOR TRICH, YEAST, CLUE  2. 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 Late  prenatal care 16 4/7 wks  4. Anemia affecting pregnancy in second trimester Kept [facility] apt with hematology 01/02/22 and they are checking with Medicaid to see if they will cover IV infusion Next apt there 10/5/2 Taking FeSo4 BID-TID with apple juice  5. Supervision of other normal pregnancy, antepartum Working 45 hrs/wk Walks track 1x/wk x 20 min Has car seat 1 hour glucola 12/24/21=110   Preterm labor symptoms and general obstetric precautions including but not limited to vaginal bleeding, contractions, leaking of fluid and fetal movement were reviewed in detail with the patient. Please refer to After Visit Summary for other counseling recommendations.  Return in about 2 weeks (around 01/21/2022) for routine PNC.  No future appointments.  Alberteen Spindle, CNM

## 2022-01-07 NOTE — Progress Notes (Signed)
Counseled on 28 week lab results. Correctly verbalizes how to take PNV and TID iron. Taking iron with Vitamin C juices. Kept 01/02/22 [facility] appt and aware of video appt with them on 01/30/2022 at 0830. Per client, Tracy Moses is verifying with her Medicaid to determine if it will cover an iron infusion. Jossie Ng, RN Wet prep reviewed - negative results. Jossie Ng, RN

## 2022-01-14 ENCOUNTER — Telehealth: Payer: Self-pay

## 2022-01-14 NOTE — Telephone Encounter (Signed)
TC from patient this morning. Patient aware of next [place] RV on 01/21/22 and wanted to let us know that at [facility] appointment on 01/02/22, she did not receive an iron infusion and based on labs done that day, she states she was told she doesn't need an infusion. Patient states she has video appointment on 01/30/22 at [facility], counseled to keep that appointment. Patient counseled that we will check her Hgb on 01/21/22 and if she has further questions or concerns, she may call or discuss them at her visit.Jenetta Downer, RN

## 2022-01-21 ENCOUNTER — Ambulatory Visit: Payer: Medicaid Other | Admitting: Advanced Practice Midwife

## 2022-01-21 VITALS — BP 113/69 | HR 97 | Temp 98.5°F | Wt 177.2 lb

## 2022-01-21 DIAGNOSIS — O0930 Supervision of pregnancy with insufficient antenatal care, unspecified trimester: Secondary | ICD-10-CM

## 2022-01-21 DIAGNOSIS — O99012 Anemia complicating pregnancy, second trimester: Secondary | ICD-10-CM

## 2022-01-21 DIAGNOSIS — O99013 Anemia complicating pregnancy, third trimester: Secondary | ICD-10-CM

## 2022-01-21 DIAGNOSIS — F1290 Cannabis use, unspecified, uncomplicated: Secondary | ICD-10-CM

## 2022-01-21 DIAGNOSIS — Z3483 Encounter for supervision of other normal pregnancy, third trimester: Secondary | ICD-10-CM

## 2022-01-21 DIAGNOSIS — Z3480 Encounter for supervision of other normal pregnancy, unspecified trimester: Secondary | ICD-10-CM

## 2022-01-21 DIAGNOSIS — O0933 Supervision of pregnancy with insufficient antenatal care, third trimester: Secondary | ICD-10-CM

## 2022-01-21 LAB — HEMOGLOBIN, FINGERSTICK: Hemoglobin: 9.4 g/dL — ABNORMAL LOW (ref 11.1–15.9)

## 2022-01-21 NOTE — Progress Notes (Signed)
[place] [facility] Maternal Health Clinic  PRENATAL VISIT NOTE  Subjective:  Tracy Moses is a 26 y.o. G3P2002 at [redacted]w[redacted]d being seen today for ongoing prenatal care.  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  Patient reports no complaints.  Contractions: Not present. Vag. Bleeding: None.  Movement: Present. Denies leaking of fluid/ROM.   The following portions of the patient's history were reviewed and updated as appropriate: allergies, current medications, past family history, past medical history, past social history, past surgical history and problem list. Problem list updated.  Objective:   Vitals:   01/21/22 0826  BP: 113/69  Pulse: 97  Temp: 98.5 F (36.9 C)  Weight: 177 lb 3.2 oz (80.4 kg)    Fetal Status: Fetal Heart Rate (bpm): 130 Fundal Height: 33 cm Movement: Present     General:  Alert, oriented and cooperative. Patient is in no acute distress.  Skin: Skin is warm and dry. No rash noted.   Cardiovascular: Normal heart rate noted  Respiratory: Normal respiratory effort, no problems with respiration noted  Abdomen: Soft, gravid, appropriate for gestational age.  Pain/Pressure: Absent     Pelvic: Cervical exam deferred        Extremities: Normal range of motion.  Edema: None  Mental Status: Normal mood and affect. Normal behavior. Normal judgment and thought content.   Assessment and Plan:  Pregnancy: G3P2002 at [redacted]w[redacted]d  1. Anemia affecting pregnancy in second trimester Taking Feso4 TID with juice Kept [facility] hematology apt 01/02/22 and they are checking to see if Medicaid will cover IV iron infusion Next apt  there 01/30/22 Hgb today  - Hemoglobin, fingerstick  2. 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 Late prenatal care 16 4/7 wks   4. Marijuana use UDS 12/03/21=neg    Preterm labor symptoms and general obstetric precautions including but not limited to vaginal bleeding, contractions, leaking of fluid and fetal movement were reviewed in detail with the patient. Please refer to After Visit Summary for other counseling recommendations.  No follow-ups on file.  No future appointments.  Herbie Saxon, CNM

## 2022-01-21 NOTE — Progress Notes (Addendum)
Patient here for MH RV at 31 4/7. Needs Hgb check today. Moving to new residence later this week, will get new address at next appointment. Still working on finding Peds. Aware of [facility] hematology video appointment on 01/30/2022. Kick counts reviewed with patient and cards given. Counseled on need for Tdap and patient family needs to be up to date on vaccines. Patient states she wants to think more about Tdap. VIS given.Jenetta Downer, RN

## 2022-01-21 NOTE — Progress Notes (Signed)
Hgb = 9.4 today. Patient counseled to continue taking iron 3x/day with vitamin C rich juice. Anemia pamphlet given.Marland KitchenMarland KitchenJenetta Downer, RN

## 2022-01-26 DIAGNOSIS — Z419 Encounter for procedure for purposes other than remedying health state, unspecified: Secondary | ICD-10-CM | POA: Diagnosis not present

## 2022-01-30 DIAGNOSIS — Z862 Personal history of diseases of the blood and blood-forming organs and certain disorders involving the immune mechanism: Secondary | ICD-10-CM | POA: Diagnosis not present

## 2022-02-04 ENCOUNTER — Ambulatory Visit: Payer: Medicaid Other | Admitting: Advanced Practice Midwife

## 2022-02-04 VITALS — BP 120/64 | Wt 178.5 lb

## 2022-02-04 DIAGNOSIS — O99012 Anemia complicating pregnancy, second trimester: Secondary | ICD-10-CM

## 2022-02-04 DIAGNOSIS — O0930 Supervision of pregnancy with insufficient antenatal care, unspecified trimester: Secondary | ICD-10-CM

## 2022-02-04 DIAGNOSIS — Z3480 Encounter for supervision of other normal pregnancy, unspecified trimester: Secondary | ICD-10-CM

## 2022-02-04 NOTE — Progress Notes (Signed)
[facility]  PRENATAL VISIT NOTE  Subjective:  Tracy Moses is a 26 y.o. G3P2002 at [redacted]w[redacted]d being seen today for ongoing prenatal care.  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  Patient reports no complaints.  Contractions: Not present. Vag. Bleeding: None.  Movement: Present. Denies leaking of fluid/ROM.   The following portions of the patient's history were reviewed and updated as appropriate: allergies, current medications, past family history, past medical history, past social history, past surgical history and problem list. Problem list updated.  Objective:   Vitals:   02/04/22 1633  BP: 120/64  Weight: 178 lb 8 oz (81 kg)    Fetal Status: Fetal Heart Rate (bpm): 140 Fundal Height: 35 cm Movement: Present     General:  Alert, oriented and cooperative. Patient is in no acute distress.  Skin: Skin is warm and dry. No rash noted.   Cardiovascular: Normal heart rate noted  Respiratory: Normal respiratory effort, no problems with respiration noted  Abdomen: Soft, gravid, appropriate for gestational age.  Pain/Pressure: Absent     Pelvic: Cervical exam deferred        Extremities: Normal range of motion.  Edema: None  Mental Status: Normal mood and affect. Normal behavior. Normal judgment and thought content.   Assessment and Plan:  Pregnancy: G3P2002 at [redacted]w[redacted]d  1. Supervision of other normal pregnancy, antepartum 38 lb 8 oz (17.5 kg) Stopped working 01/22/22 Walking track 1-2x/wk x 20 min Moved into a house with her mom and kids recently  2. Late prenatal care 16 4/7 wks   3. Anemia  affecting pregnancy in second trimester Kept [facility] heme apt 01/02/22 and 01/30/22 and was told she didn't need IV iron infusion because of her beta thalassemia minor Taking FeSo4 TID with juice   Preterm labor symptoms and general obstetric precautions including but not limited to vaginal bleeding, contractions, leaking of fluid and fetal movement were reviewed in detail with the patient. Please refer to After Visit Summary for other counseling recommendations.  No follow-ups on file.  Future Appointments  Date Time Provider [place]  02/18/2022 10:20 AM [facility] PROVIDER [facility] None    Herbie Saxon, CNM

## 2022-02-04 NOTE — Progress Notes (Signed)
Patient here for MH RV at [redacted]w[redacted]d.   Patient has moved address and now updated.   [facility] video appt kept on 01/30/22. Per notes patient needs to f/u in 4 weeks for VV appt to discuss results. RN attempted to call to make appt but according to VM, they close after 4:30.   Gave patient number [phone] to call and make appt.   Patient states she will have new phone number soon - verify with patient next [place] RV.   Al Decant, RN

## 2022-02-18 ENCOUNTER — Ambulatory Visit: Payer: Medicaid Other | Admitting: Advanced Practice Midwife

## 2022-02-18 VITALS — BP 124/66 | HR 89 | Temp 97.0°F | Wt 179.2 lb

## 2022-02-18 DIAGNOSIS — O0933 Supervision of pregnancy with insufficient antenatal care, third trimester: Secondary | ICD-10-CM

## 2022-02-18 DIAGNOSIS — O99012 Anemia complicating pregnancy, second trimester: Secondary | ICD-10-CM

## 2022-02-18 DIAGNOSIS — O0930 Supervision of pregnancy with insufficient antenatal care, unspecified trimester: Secondary | ICD-10-CM

## 2022-02-18 DIAGNOSIS — O99013 Anemia complicating pregnancy, third trimester: Secondary | ICD-10-CM

## 2022-02-18 DIAGNOSIS — Z3480 Encounter for supervision of other normal pregnancy, unspecified trimester: Secondary | ICD-10-CM

## 2022-02-18 DIAGNOSIS — F1290 Cannabis use, unspecified, uncomplicated: Secondary | ICD-10-CM

## 2022-02-18 DIAGNOSIS — Z3483 Encounter for supervision of other normal pregnancy, third trimester: Secondary | ICD-10-CM

## 2022-02-18 LAB — URINALYSIS
Bilirubin, UA: NEGATIVE
Glucose, UA: NEGATIVE
Nitrite, UA: NEGATIVE
RBC, UA: NEGATIVE
Specific Gravity, UA: 1.02 (ref 1.005–1.030)
Urobilinogen, Ur: 4 mg/dL — ABNORMAL HIGH (ref 0.2–1.0)
pH, UA: 7 (ref 5.0–7.5)

## 2022-02-18 LAB — HEMOGLOBIN, FINGERSTICK: Hemoglobin: 9.7 g/dL — ABNORMAL LOW (ref 11.1–15.9)

## 2022-02-18 NOTE — Progress Notes (Signed)
TDAP VIS given. Provider aware of hgb recheck during visit. No change in orders. Patient to call Hematology for a follow up appointment. BThiele RN

## 2022-02-18 NOTE — Progress Notes (Signed)
[facility]  PRENATAL VISIT NOTE  Subjective:  Tracy Moses is a 26 y.o. G3P2002 at [redacted]w[redacted]d being seen today for ongoing prenatal care.  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  Patient reports no complaints.  Contractions: Not present. Vag. Bleeding: None.  Movement: Present. Denies leaking of fluid/ROM.   The following portions of the patient's history were reviewed and updated as appropriate: allergies, current medications, past family history, past medical history, past social history, past surgical history and problem list. Problem list updated.  Objective:   Vitals:   02/18/22 1025  BP: 124/66  Pulse: 89  Temp: (!) 97 F (36.1 C)  Weight: 179 lb 3.2 oz (81.3 kg)    Fetal Status: Fetal Heart Rate (bpm): 130 Fundal Height: 37 cm Movement: Present  Presentation: Vertex  General:  Alert, oriented and cooperative. Patient is in no acute distress.  Skin: Skin is warm and dry. No rash noted.   Cardiovascular: Normal heart rate noted  Respiratory: Normal respiratory effort, no problems with respiration noted  Abdomen: Soft, gravid, appropriate for gestational age.  Pain/Pressure: Absent     Pelvic: Cervical exam deferred        Extremities: Normal range of motion.  Edema: None  Mental Status: Normal mood and affect. Normal behavior. Normal judgment and thought content.   Assessment and Plan:  Pregnancy: G3P2002 at [redacted]w[redacted]d  1. Supervision of other normal pregnancy, antepartum GC/Chlamydia/GBS cultures self collected Not working since 01/22/22 Walking track 2x/wk x 20 min 39 lb 3.2 oz (17.8  kg) C/o GERD--suggestions given Knows when to go to L&D Has car seat and crib BP 124/66 denies h/a, scotoma  - Urinalysis (Urine Dip) - 643329 Drug Screen  2. Anemia affecting pregnancy in second trimester Taking FeSo4 TID with oj [facility] hematology apt 01/02/22, 01/30/22 and was told to call them back 4 wks later; has phone # - Hemoglobin, venipuncture  3. Late prenatal care 16 4/7 wks   4. Marijuana use Denies use; agrees to UDS   Preterm labor symptoms and general obstetric precautions including but not limited to vaginal bleeding, contractions, leaking of fluid and fetal movement were reviewed in detail with the patient. Please refer to After Visit Summary for other counseling recommendations.  No follow-ups on file.  No future appointments.  Herbie Saxon, CNM

## 2022-02-19 LAB — 789231 7+OXYCODONE-BUND
Amphetamines, Urine: NEGATIVE ng/mL
BENZODIAZ UR QL: NEGATIVE ng/mL
Barbiturate screen, urine: NEGATIVE ng/mL
Cannabinoid Quant, Ur: NEGATIVE ng/mL
Cocaine (Metab.): NEGATIVE ng/mL
OPIATE SCREEN URINE: NEGATIVE ng/mL
Oxycodone/Oxymorphone, Urine: NEGATIVE ng/mL
PCP Quant, Ur: NEGATIVE ng/mL

## 2022-02-22 LAB — CULTURE, BETA STREP (GROUP B ONLY): Strep Gp B Culture: NEGATIVE

## 2022-02-22 LAB — CHLAMYDIA/GC NAA, CONFIRMATION
Chlamydia trachomatis, NAA: NEGATIVE
Neisseria gonorrhoeae, NAA: NEGATIVE

## 2022-02-26 DIAGNOSIS — Z419 Encounter for procedure for purposes other than remedying health state, unspecified: Secondary | ICD-10-CM | POA: Diagnosis not present

## 2022-02-27 ENCOUNTER — Ambulatory Visit: Payer: Medicaid Other | Admitting: Physician Assistant

## 2022-02-27 ENCOUNTER — Encounter: Payer: Self-pay | Admitting: Physician Assistant

## 2022-02-27 ENCOUNTER — Ambulatory Visit: Payer: Medicaid Other

## 2022-02-27 VITALS — BP 126/72 | HR 101 | Temp 98.1°F | Wt 181.4 lb

## 2022-02-27 DIAGNOSIS — O99013 Anemia complicating pregnancy, third trimester: Secondary | ICD-10-CM

## 2022-02-27 DIAGNOSIS — F1290 Cannabis use, unspecified, uncomplicated: Secondary | ICD-10-CM

## 2022-02-27 DIAGNOSIS — A6000 Herpesviral infection of urogenital system, unspecified: Secondary | ICD-10-CM

## 2022-02-27 DIAGNOSIS — Z3480 Encounter for supervision of other normal pregnancy, unspecified trimester: Secondary | ICD-10-CM

## 2022-02-27 LAB — URINALYSIS
Bilirubin, UA: NEGATIVE
Glucose, UA: NEGATIVE
Nitrite, UA: NEGATIVE
Protein,UA: NEGATIVE
RBC, UA: NEGATIVE
Specific Gravity, UA: 1.02 (ref 1.005–1.030)
Urobilinogen, Ur: 4 mg/dL — ABNORMAL HIGH (ref 0.2–1.0)
pH, UA: 7 (ref 5.0–7.5)

## 2022-02-27 NOTE — Addendum Note (Signed)
Addended by: Cletis Media on: 02/27/2022 12:23 PM   Modules accepted: Orders

## 2022-02-27 NOTE — Progress Notes (Signed)
[facility]  PRENATAL VISIT NOTE  Subjective:  Tracy Moses is a 26 y.o. G3P2002 at [redacted]w[redacted]d being seen today for ongoing prenatal care.  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  Patient reports  occ Braxton-Hicks type contraction, only several per day .  Contractions: Not present. Vag. Bleeding: None.  Movement: Present. Has infant carseat. Denies leaking of fluid/ROM.   The following portions of the patient's history were reviewed and updated as appropriate: allergies, current medications, past family history, past medical history, past social history, past surgical history and problem list. Problem list updated.  Objective:   Vitals:   02/27/22 1329  BP: 126/72  Pulse: (!) 101  Temp: 98.1 F (36.7 C)  Weight: 181 lb 6.4 oz (82.3 kg)    Fetal Status: Fetal Heart Rate (bpm): 124 Fundal Height: 37 cm Movement: Present  Presentation: Vertex  General:  Alert, oriented and cooperative. Patient is in no acute distress.  Skin: Skin is warm and dry. No rash noted.   Cardiovascular: Normal heart rate noted  Respiratory: Normal respiratory effort, no problems with respiration noted  Abdomen: Soft, gravid, appropriate for gestational age.  Pain/Pressure: Absent     Pelvic: Cervical exam deferred        Extremities: Normal range of motion.  Edema: None  Mental Status: Normal mood and affect. Normal behavior. Normal judgment and thought content.   Assessment and Plan:  Pregnancy: G3P2002 at [redacted]w[redacted]d  1. Supervision of other normal pregnancy, antepartum Feels well. Continue weekly visits. -  Urinalysis (Urine Dip)  2. Genital herpes simplex, unspecified site Start acyclovir (will pick up Rx today). Denies herpes sx.  3. Anemia affecting pregnancy in third trimester Continue oral iron. Completed iron infusion. H/o iron deficiency and alpha thal.  4. Marijuana use Reviewed neg UDS from last visit. Enc ongoing abstinence.   Preterm labor symptoms and general obstetric precautions including but not limited to vaginal bleeding, contractions, leaking of fluid and fetal movement were reviewed in detail with the patient. Please refer to After Visit Summary for other counseling recommendations.  Return in about 1 week (around 03/06/2022) for Routine prenatal care.  No future appointments.  Lora Havens, PA-C

## 2022-03-06 ENCOUNTER — Ambulatory Visit: Payer: Medicaid Other

## 2022-03-06 ENCOUNTER — Ambulatory Visit: Payer: Medicaid Other | Admitting: Physician Assistant

## 2022-03-06 VITALS — BP 121/73 | HR 99 | Temp 99.3°F | Wt 184.2 lb

## 2022-03-06 DIAGNOSIS — Z3483 Encounter for supervision of other normal pregnancy, third trimester: Secondary | ICD-10-CM

## 2022-03-06 DIAGNOSIS — Z3480 Encounter for supervision of other normal pregnancy, unspecified trimester: Secondary | ICD-10-CM

## 2022-03-06 DIAGNOSIS — O99013 Anemia complicating pregnancy, third trimester: Secondary | ICD-10-CM

## 2022-03-06 DIAGNOSIS — A6000 Herpesviral infection of urogenital system, unspecified: Secondary | ICD-10-CM

## 2022-03-06 MED ORDER — VALACYCLOVIR HCL 500 MG PO TABS: 500.0000 mg | ORAL_TABLET | Freq: Two times a day (BID) | ORAL | 6 refills | Status: AC

## 2022-03-06 NOTE — Progress Notes (Signed)
Correctly verbalizes how to take iron and prenatal vitamin. Taking iron with either pineapple or cranberry juice. States was not able to get through to [facility] to schedule 4 week video visit appt (from 01/30/22 appt). Phone number for clinic provided. Per client, went to pharmacy of record and told did not have Acyclovir prescription. ARobbie Lis PA-C notified today. Declines flu vaccine. Verified has [facility] contact card. Jossie Ng, RN 1 week [facility] RV appt scheduled and appt card given to client. Jossie Ng, RN

## 2022-03-06 NOTE — Progress Notes (Signed)
[name]  PRENATAL VISIT NOTE  Subjective:  Tracy Moses is a 26 y.o. G3P2002 at [redacted]w[redacted]d being seen today for ongoing prenatal care.  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  Patient reports occasional contractions.  Contractions: Irritability. Vag. Bleeding: None.  Movement: Present. Contractions are irreg, mild, never more than 2/hour. Denies leaking of fluid/ROM.   The following portions of the patient's history were reviewed and updated as appropriate: allergies, current medications, past family history, past medical history, past social history, past surgical history and problem list. Problem list updated.  Objective:   Vitals:   03/06/22 1123  BP: 121/73  Pulse: 99  Temp: 99.3 F (37.4 C)  Weight: 184 lb 3.2 oz (83.6 kg)    Fetal Status: Fetal Heart Rate (bpm): 135 Fundal Height: 37 cm Movement: Present  Presentation: Vertex  General:  Alert, oriented and cooperative. Patient is in no acute distress.  Skin: Skin is warm and dry. No rash noted.   Cardiovascular: Normal heart rate noted  Respiratory: Normal respiratory effort, no problems with respiration noted  Abdomen: Soft, gravid, appropriate for gestational age.  Pain/Pressure: Absent     Pelvic: Cervical exam deferred        Extremities: Normal range of motion.  Edema: None  Mental Status: Normal mood and affect. Normal behavior. Normal judgment and thought content.   Assessment and Plan:  Pregnancy: G3P2002 at [redacted]w[redacted]d  1. Supervision of other normal pregnancy, antepartum Continue current care.  2. Genital herpes simplex,  unspecified site Denies genital herpes symptoms. Start suppressive therapy. - valACYclovir (VALTREX) 500 MG tablet; Take 1 tablet (500 mg total) by mouth 2 (two) times daily.  Dispense: 60 tablet; Refill: 6  3. Anemia affecting pregnancy in third trimester Continue oral iron.   Term labor symptoms and general obstetric precautions including but not limited to vaginal bleeding, contractions, leaking of fluid and fetal movement were reviewed in detail with the patient. Please refer to After Visit Summary for other counseling recommendations.  Return in about 1 week (around 03/13/2022) for Routine prenatal care.  Future Appointments  Date Time Provider Department Center  03/13/2022  3:20 PM [facility] PROVIDER [facility] None    Tracy Dyke, PA-C

## 2022-03-13 ENCOUNTER — Encounter: Payer: Self-pay | Admitting: Physician Assistant

## 2022-03-13 ENCOUNTER — Ambulatory Visit: Payer: Medicaid Other | Admitting: Physician Assistant

## 2022-03-13 VITALS — BP 135/78 | HR 90 | Temp 98.2°F | Wt 183.8 lb

## 2022-03-13 DIAGNOSIS — O99013 Anemia complicating pregnancy, third trimester: Secondary | ICD-10-CM

## 2022-03-13 DIAGNOSIS — Z3480 Encounter for supervision of other normal pregnancy, unspecified trimester: Secondary | ICD-10-CM

## 2022-03-13 DIAGNOSIS — A6000 Herpesviral infection of urogenital system, unspecified: Secondary | ICD-10-CM

## 2022-03-13 LAB — URINALYSIS
Bilirubin, UA: NEGATIVE
Glucose, UA: NEGATIVE
Ketones, UA: NEGATIVE
Nitrite, UA: NEGATIVE
Protein,UA: NEGATIVE
RBC, UA: NEGATIVE
Specific Gravity, UA: 1.02 (ref 1.005–1.030)
Urobilinogen, Ur: 1 mg/dL (ref 0.2–1.0)
pH, UA: 7 (ref 5.0–7.5)

## 2022-03-13 NOTE — Progress Notes (Addendum)
Urinalysis result reviewed by provider. IOL faxed with ok confirmation. Called [facility] an left message.  Martyn Malay, RN

## 2022-03-13 NOTE — Progress Notes (Signed)
[facility]  PRENATAL VISIT NOTE  Subjective:  Tracy Moses is a 26 y.o. G3P2002 at [redacted]w[redacted]d being seen today for ongoing prenatal care.  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  Patient reports occasional contractions.  Contractions: Irritability. Vag. Bleeding: None.  Movement: Present. Denies leaking of fluid/ROM. Having as much as 1 Braxton-Hicks type contraction per hour.  The following portions of the patient's history were reviewed and updated as appropriate: allergies, current medications, past family history, past medical history, past social history, past surgical history and problem list. Problem list updated.  Objective:   Vitals:   03/13/22 1515  BP: 135/78  Pulse: 90  Temp: 98.2 F (36.8 C)  Weight: 183 lb 12.8 oz (83.4 kg)    Fetal Status: Fetal Heart Rate (bpm): 132 Fundal Height: 39 cm Movement: Present  Presentation: Vertex  General:  Alert, oriented and cooperative. Patient is in no acute distress.  Skin: Skin is warm and dry. No rash noted.   Cardiovascular: Normal heart rate noted  Respiratory: Normal respiratory effort, no problems with respiration noted  Abdomen: Soft, gravid, appropriate for gestational age.  Pain/Pressure: Absent     Pelvic: Cervical exam performed Dilation: 1 Effacement (%): 20 Station: Ballotable  Extremities: Normal range of motion.  Edema: None  Mental Status: Normal mood and affect. Normal behavior. Normal judgment and thought content.   Assessment and Plan:  Pregnancy: G3P2002 at [redacted]w[redacted]d  1. Supervision of other normal pregnancy,  antepartum BP elevated today. CCUA neg protein. Pt denies HA, RUQ pain, edema or visual sx. She is counseled to monitor for these preeclampsia sx at home and to seek urgent care here or at hosp if any develop. Discussed option for IOL at 41-42 wk. Pt desires IOL at 6 0/7, [facility] Post-Term Induction Scheduling form completed for IOL on 03/28/22. - Urinalysis  2. Anemia affecting pregnancy in third trimester Continue oral iron.  3. Genital herpes simplex, unspecified site Pt has started antiviral suppression, no sx, no probs with medication. To continue til delivery.   Term labor symptoms and general obstetric precautions including but not limited to vaginal bleeding, contractions, leaking of fluid and fetal movement were reviewed in detail with the patient. Please refer to After Visit Summary for other counseling recommendations.  Return in about 1 week (around 03/20/2022) for Routine prenatal care.  No future appointments.  Landry Dyke, PA-C

## 2022-03-17 ENCOUNTER — Telehealth: Payer: Self-pay

## 2022-03-17 NOTE — Telephone Encounter (Signed)
TC to patient to inform of [facility] IOL date and time. IOL is scheduled for 03/28/22. Patient will receive a phone call from [facility] between 11:00 am and 2:00 pm on that day to tell her when to come to the hospital. LM for patient with number to call.   Patient called back and given information. Patient reminded of her appointment at [facility] tomorrow. Patient states understanding.Burt Knack, RN

## 2022-03-18 ENCOUNTER — Ambulatory Visit: Payer: Medicaid Other | Admitting: Advanced Practice Midwife

## 2022-03-18 VITALS — BP 120/77 | HR 92 | Temp 97.4°F | Wt 186.8 lb

## 2022-03-18 DIAGNOSIS — Z3483 Encounter for supervision of other normal pregnancy, third trimester: Secondary | ICD-10-CM

## 2022-03-18 DIAGNOSIS — O0933 Supervision of pregnancy with insufficient antenatal care, third trimester: Secondary | ICD-10-CM

## 2022-03-18 DIAGNOSIS — O0930 Supervision of pregnancy with insufficient antenatal care, unspecified trimester: Secondary | ICD-10-CM

## 2022-03-18 DIAGNOSIS — A6000 Herpesviral infection of urogenital system, unspecified: Secondary | ICD-10-CM

## 2022-03-18 DIAGNOSIS — O99013 Anemia complicating pregnancy, third trimester: Secondary | ICD-10-CM

## 2022-03-18 DIAGNOSIS — Z3480 Encounter for supervision of other normal pregnancy, unspecified trimester: Secondary | ICD-10-CM

## 2022-03-18 LAB — URINALYSIS
Bilirubin, UA: NEGATIVE
Glucose, UA: NEGATIVE
Nitrite, UA: NEGATIVE
Protein,UA: NEGATIVE
RBC, UA: NEGATIVE
Specific Gravity, UA: 1.02 (ref 1.005–1.030)
Urobilinogen, Ur: 1 mg/dL (ref 0.2–1.0)
pH, UA: 7 (ref 5.0–7.5)

## 2022-03-18 LAB — HEMOGLOBIN, FINGERSTICK: Hemoglobin: 10.4 g/dL — ABNORMAL LOW (ref 11.1–15.9)

## 2022-03-18 MED ORDER — FERROUS SULFATE 325 (65 FE) MG PO TABS: 325.0000 mg | ORAL_TABLET | Freq: Two times a day (BID) | ORAL | 3 refills | Status: AC

## 2022-03-18 NOTE — Progress Notes (Signed)
[facility]  PRENATAL VISIT NOTE  Subjective:  Tracy Moses is a 26 y.o. G3P2002 at [redacted]w[redacted]d being seen today for ongoing prenatal care.  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  Patient reports no complaints.  Contractions: Not present. Vag. Bleeding: None.  Movement: Present. Denies leaking of fluid/ROM.   The following portions of the patient's history were reviewed and updated as appropriate: allergies, current medications, past family history, past medical history, past social history, past surgical history and problem list. Problem list updated.  Objective:   Vitals:   03/18/22 1258  BP: 138/81  Pulse: 97  Temp: (!) 97.4 F (36.3 C)  Weight: 186 lb 12.8 oz (84.7 kg)    Fetal Status: Fetal Heart Rate (bpm): 156 Fundal Height: 39 cm Movement: Present  Presentation: Vertex  General:  Alert, oriented and cooperative. Patient is in no acute distress.  Skin: Skin is warm and dry. No rash noted.   Cardiovascular: Normal heart rate noted  Respiratory: Normal respiratory effort, no problems with respiration noted  Abdomen: Soft, gravid, appropriate for gestational age.  Pain/Pressure: Absent     Pelvic: Cervical exam deferred        Extremities: Normal range of motion.  Edema: None  Mental Status: Normal mood and affect. Normal behavior. Normal judgment and thought content.   Assessment and Plan:  Pregnancy: G3P2002 at [redacted]w[redacted]d  1. Supervision of other normal pregnancy, antepartum Knows when to go to L&D Not working Has car seat and crib Walking 3x/wk x 10 min 46 lb 12.8 oz (21.2 kg) BP  trending up 138/81 today; denies h/a, scotoma, epigastric pain, edema U/a neg proteinuria IOL 03/28/22  - Urinalysis (Urine Dip) - 136438 Drug Screen  2. Anemia affecting pregnancy in third trimester Taking FeSo4 TID with apple juice Hgb=9.7 on 02/18/22 Iron infusion 01/02/22 Kept 01/30/22 [facility] Heme video apt and was told to come back in 4 wks and given # to call--pt states she did not do  - Hemoglobin, venipuncture  3. Genital herpes simplex, unspecified site Taking Acyclovir BID  4. Late prenatal care 16 4/7 wks    Preterm labor symptoms and general obstetric precautions including but not limited to vaginal bleeding, contractions, leaking of fluid and fetal movement were reviewed in detail with the patient. Please refer to After Visit Summary for other counseling recommendations.  Return in about 1 week (around 03/25/2022) for routine PNC.  No future appointments.  Alberteen Spindle, CNM

## 2022-03-18 NOTE — Progress Notes (Signed)
Taking Iron TID with OJ. Hgb recheck. Aware of IOL date. In House labs reviewed at visit. Decrease iron to bid. Repeat BP 120/77 BThiele RN

## 2022-03-19 LAB — 789231 7+OXYCODONE-BUND
Amphetamines, Urine: NEGATIVE ng/mL
BENZODIAZ UR QL: NEGATIVE ng/mL
Barbiturate screen, urine: NEGATIVE ng/mL
Cannabinoid Quant, Ur: NEGATIVE ng/mL
Cocaine (Metab.): NEGATIVE ng/mL
OPIATE SCREEN URINE: NEGATIVE ng/mL
Oxycodone/Oxymorphone, Urine: NEGATIVE ng/mL
PCP Quant, Ur: NEGATIVE ng/mL

## 2022-03-27 ENCOUNTER — Ambulatory Visit: Payer: Medicaid Other

## 2022-03-27 ENCOUNTER — Encounter: Payer: Self-pay | Admitting: Physician Assistant

## 2022-03-27 ENCOUNTER — Ambulatory Visit: Payer: Medicaid Other | Admitting: Physician Assistant

## 2022-03-27 VITALS — BP 125/80 | HR 98 | Temp 98.5°F | Wt 188.2 lb

## 2022-03-27 DIAGNOSIS — O99013 Anemia complicating pregnancy, third trimester: Secondary | ICD-10-CM

## 2022-03-27 DIAGNOSIS — O480 Post-term pregnancy: Secondary | ICD-10-CM

## 2022-03-27 DIAGNOSIS — Z3480 Encounter for supervision of other normal pregnancy, unspecified trimester: Secondary | ICD-10-CM

## 2022-03-27 DIAGNOSIS — A6000 Herpesviral infection of urogenital system, unspecified: Secondary | ICD-10-CM

## 2022-03-27 DIAGNOSIS — Z3483 Encounter for supervision of other normal pregnancy, third trimester: Secondary | ICD-10-CM

## 2022-03-27 NOTE — Progress Notes (Signed)
[facility]  PRENATAL VISIT NOTE  Subjective:  Tracy Moses is a 26 y.o. G3P2002 at [redacted]w[redacted]d being seen today for ongoing prenatal care.  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  Patient reports  light spotting over last few days, called hospital and was reassured this was normal .  Contractions: Not present. Vag. Bleeding: Scant.  Movement: Present. Denies leaking of fluid/ROM.   The following portions of the patient's history were reviewed and updated as appropriate: allergies, current medications, past family history, past medical history, past social history, past surgical history and problem list. Problem list updated.  Objective:   Vitals:   03/27/22 1308  BP: 125/80  Pulse: 98  Temp: 98.5 F (36.9 C)  Weight: 188 lb 3.2 oz (85.4 kg)    Fetal Status: Fetal Heart Rate (bpm): 136 Fundal Height: 41 cm Movement: Present  Presentation: Vertex  General:  Alert, oriented and cooperative. Patient is in no acute distress.  Skin: Skin is warm and dry. No rash noted.   Cardiovascular: Normal heart rate noted  Respiratory: Normal respiratory effort, no problems with respiration noted  Abdomen: Soft, gravid, appropriate for gestational age.  Pain/Pressure: Absent     Pelvic: Cervical exam deferred        Extremities: Normal range of motion.  Edema: None  Mental Status: Normal mood and affect. Normal behavior. Normal judgment and thought content.   Assessment and Plan:  Pregnancy: G3P2002 at [redacted]w[redacted]d  1. Supervision of other normal pregnancy,  antepartum No further visits, anticipate routine postpartum exam at 6wk pp.  2. Post-term pregnancy, 40-42 weeks of gestation To keep IOL at [facility] tomorrow at 38 0/7.  3. Anemia affecting pregnancy in third trimester Continue oral iron (iron deficiency) and beta thalassemia minor.  4. Genital herpes simplex, unspecified site Continue suppressive therapy. No outbreaks.   Term labor symptoms and general obstetric precautions including but not limited to vaginal bleeding, contractions, leaking of fluid and fetal movement were reviewed in detail with the patient. Please refer to After Visit Summary for other counseling recommendations.  Return in about 6 weeks (around 05/08/2022) for routine postpartum care.  Future Appointments  Date Time Provider Department Center  03/27/2022  1:20 PM Antinio Sanderfer, Mathis Dad, PA-C [facility] None    Landry Dyke, [place]

## 2022-03-27 NOTE — Progress Notes (Signed)
Aware to expect call from [facility] tomorrow between 1100 - 1400 with time to arrive for IOL. Correctly verbalizes how to take prenatal vitamin and iron tablet. Taking iron tablet with juice. Jossie Ng, RN

## 2022-03-28 DIAGNOSIS — O480 Post-term pregnancy: Secondary | ICD-10-CM | POA: Diagnosis not present

## 2022-03-28 DIAGNOSIS — O99892 Other specified diseases and conditions complicating childbirth: Secondary | ICD-10-CM | POA: Diagnosis not present

## 2022-03-28 DIAGNOSIS — O99013 Anemia complicating pregnancy, third trimester: Secondary | ICD-10-CM | POA: Diagnosis not present

## 2022-03-28 DIAGNOSIS — Z3483 Encounter for supervision of other normal pregnancy, third trimester: Secondary | ICD-10-CM | POA: Diagnosis not present

## 2022-03-28 DIAGNOSIS — D509 Iron deficiency anemia, unspecified: Secondary | ICD-10-CM | POA: Diagnosis not present

## 2022-03-28 DIAGNOSIS — D649 Anemia, unspecified: Secondary | ICD-10-CM | POA: Diagnosis not present

## 2022-03-28 DIAGNOSIS — Z3A41 41 weeks gestation of pregnancy: Secondary | ICD-10-CM | POA: Diagnosis not present

## 2022-03-28 DIAGNOSIS — R768 Other specified abnormal immunological findings in serum: Secondary | ICD-10-CM | POA: Diagnosis not present

## 2022-03-28 DIAGNOSIS — A6000 Herpesviral infection of urogenital system, unspecified: Secondary | ICD-10-CM | POA: Diagnosis not present

## 2022-03-28 DIAGNOSIS — Z419 Encounter for procedure for purposes other than remedying health state, unspecified: Secondary | ICD-10-CM | POA: Diagnosis not present

## 2022-03-28 DIAGNOSIS — Z8619 Personal history of other infectious and parasitic diseases: Secondary | ICD-10-CM | POA: Diagnosis not present

## 2022-03-28 DIAGNOSIS — O9902 Anemia complicating childbirth: Secondary | ICD-10-CM | POA: Diagnosis not present

## 2022-03-28 DIAGNOSIS — O9832 Other infections with a predominantly sexual mode of transmission complicating childbirth: Secondary | ICD-10-CM | POA: Diagnosis not present

## 2022-03-28 DIAGNOSIS — O98313 Other infections with a predominantly sexual mode of transmission complicating pregnancy, third trimester: Secondary | ICD-10-CM | POA: Diagnosis not present

## 2022-03-29 DIAGNOSIS — R768 Other specified abnormal immunological findings in serum: Secondary | ICD-10-CM | POA: Diagnosis not present

## 2022-03-29 DIAGNOSIS — B009 Herpesviral infection, unspecified: Secondary | ICD-10-CM | POA: Diagnosis not present

## 2022-03-29 DIAGNOSIS — N9089 Other specified noninflammatory disorders of vulva and perineum: Secondary | ICD-10-CM | POA: Diagnosis not present

## 2022-03-29 DIAGNOSIS — R509 Fever, unspecified: Secondary | ICD-10-CM | POA: Diagnosis not present

## 2022-03-29 DIAGNOSIS — D509 Iron deficiency anemia, unspecified: Secondary | ICD-10-CM | POA: Diagnosis not present

## 2022-03-30 DIAGNOSIS — O99013 Anemia complicating pregnancy, third trimester: Secondary | ICD-10-CM | POA: Diagnosis not present

## 2022-03-30 DIAGNOSIS — O98313 Other infections with a predominantly sexual mode of transmission complicating pregnancy, third trimester: Secondary | ICD-10-CM | POA: Diagnosis not present

## 2022-03-30 DIAGNOSIS — D649 Anemia, unspecified: Secondary | ICD-10-CM | POA: Diagnosis not present

## 2022-03-30 DIAGNOSIS — Z3A41 41 weeks gestation of pregnancy: Secondary | ICD-10-CM | POA: Diagnosis not present

## 2022-04-28 DIAGNOSIS — Z419 Encounter for procedure for purposes other than remedying health state, unspecified: Secondary | ICD-10-CM | POA: Diagnosis not present

## 2022-05-15 ENCOUNTER — Encounter: Payer: Self-pay | Admitting: Nurse Practitioner

## 2022-05-15 ENCOUNTER — Ambulatory Visit: Payer: Medicaid Other | Admitting: Nurse Practitioner

## 2022-05-15 ENCOUNTER — Ambulatory Visit: Payer: Medicaid Other

## 2022-05-15 VITALS — BP 153/97 | Ht 62.0 in | Wt 168.6 lb

## 2022-05-15 DIAGNOSIS — Z0000 Encounter for general adult medical examination without abnormal findings: Secondary | ICD-10-CM

## 2022-05-15 DIAGNOSIS — Z3009 Encounter for other general counseling and advice on contraception: Secondary | ICD-10-CM | POA: Diagnosis not present

## 2022-05-15 MED ORDER — NORETHINDRONE 0.35 MG PO TABS: 1.0000 | ORAL_TABLET | Freq: Every day | ORAL | 12 refills | Status: AC
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
## 2022-05-29 DIAGNOSIS — Z419 Encounter for procedure for purposes other than remedying health state, unspecified: Secondary | ICD-10-CM | POA: Diagnosis not present

## 2022-06-27 DIAGNOSIS — Z419 Encounter for procedure for purposes other than remedying health state, unspecified: Secondary | ICD-10-CM | POA: Diagnosis not present

## 2022-07-28 DIAGNOSIS — Z419 Encounter for procedure for purposes other than remedying health state, unspecified: Secondary | ICD-10-CM | POA: Diagnosis not present

## 2022-08-27 DIAGNOSIS — Z419 Encounter for procedure for purposes other than remedying health state, unspecified: Secondary | ICD-10-CM | POA: Diagnosis not present

## 2022-09-27 DIAGNOSIS — Z419 Encounter for procedure for purposes other than remedying health state, unspecified: Secondary | ICD-10-CM | POA: Diagnosis not present

## 2022-10-27 DIAGNOSIS — Z419 Encounter for procedure for purposes other than remedying health state, unspecified: Secondary | ICD-10-CM | POA: Diagnosis not present

## 2022-11-27 DIAGNOSIS — Z419 Encounter for procedure for purposes other than remedying health state, unspecified: Secondary | ICD-10-CM | POA: Diagnosis not present

## 2022-12-28 DIAGNOSIS — Z419 Encounter for procedure for purposes other than remedying health state, unspecified: Secondary | ICD-10-CM | POA: Diagnosis not present

## 2023-01-27 DIAGNOSIS — Z419 Encounter for procedure for purposes other than remedying health state, unspecified: Secondary | ICD-10-CM | POA: Diagnosis not present

## 2023-02-27 DIAGNOSIS — Z419 Encounter for procedure for purposes other than remedying health state, unspecified: Secondary | ICD-10-CM | POA: Diagnosis not present

## 2023-03-29 DIAGNOSIS — Z419 Encounter for procedure for purposes other than remedying health state, unspecified: Secondary | ICD-10-CM | POA: Diagnosis not present

## 2023-04-29 DIAGNOSIS — Z419 Encounter for procedure for purposes other than remedying health state, unspecified: Secondary | ICD-10-CM | POA: Diagnosis not present

## 2023-05-30 DIAGNOSIS — Z419 Encounter for procedure for purposes other than remedying health state, unspecified: Secondary | ICD-10-CM | POA: Diagnosis not present

## 2023-06-27 DIAGNOSIS — Z419 Encounter for procedure for purposes other than remedying health state, unspecified: Secondary | ICD-10-CM | POA: Diagnosis not present

## 2023-08-08 DIAGNOSIS — Z419 Encounter for procedure for purposes other than remedying health state, unspecified: Secondary | ICD-10-CM | POA: Diagnosis not present

## 2023-09-07 DIAGNOSIS — Z419 Encounter for procedure for purposes other than remedying health state, unspecified: Secondary | ICD-10-CM | POA: Diagnosis not present

## 2023-10-08 DIAGNOSIS — Z419 Encounter for procedure for purposes other than remedying health state, unspecified: Secondary | ICD-10-CM | POA: Diagnosis not present

## 2023-11-07 DIAGNOSIS — Z419 Encounter for procedure for purposes other than remedying health state, unspecified: Secondary | ICD-10-CM | POA: Diagnosis not present

## 2024-01-08 ENCOUNTER — Ambulatory Visit: Admitting: Family Medicine

## 2024-01-08 DIAGNOSIS — Z113 Encounter for screening for infections with a predominantly sexual mode of transmission: Secondary | ICD-10-CM

## 2024-01-08 LAB — WET PREP FOR TRICH, YEAST, CLUE
Trichomonas Exam: NEGATIVE
Yeast Exam: NEGATIVE

## 2024-01-08 LAB — HM HIV SCREENING LAB: HM HIV Screening: NEGATIVE

## 2024-01-08 NOTE — Progress Notes (Signed)
 Pt is here for STI screening and report having no symptoms. Wet prep results review no treatment required per SO. Teachings given to patient on how to take precautionary measures. Condoms given. Wilkie Drought, RN.

## 2024-01-08 NOTE — Progress Notes (Signed)
 Attestation of Attending Supervision: Evaluation, management, and procedures were performed by standing order with my collaboration.  I have reviewed the  note and chart, and I agree with the management and plan.  Dorothyann Helling, MD Clinical Services Medical Director [facility] Department 01/08/24 10:08 AM

## 2024-01-29 ENCOUNTER — Emergency Department: Admission: EM | Admit: 2024-01-29 | Discharge: 2024-01-29 | Disposition: A | Discharge status: Home / Self Care

## 2024-01-29 ENCOUNTER — Other Ambulatory Visit: Payer: Self-pay

## 2024-01-29 DIAGNOSIS — M25561 Pain in right knee: Secondary | ICD-10-CM | POA: Insufficient documentation

## 2024-01-29 DIAGNOSIS — Y92410 Unspecified street and highway as the place of occurrence of the external cause: Secondary | ICD-10-CM | POA: Diagnosis not present

## 2024-01-29 DIAGNOSIS — M545 Low back pain, unspecified: Secondary | ICD-10-CM | POA: Diagnosis present
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
## 2024-01-29 NOTE — Discharge Instructions (Signed)
 Please follow-up with your outpatient provider.  You may apply ice to your knee as we discussed, or warm compresses to your low back as we discussed.  Please return for any new, worsening, or changing symptoms or other concerns.  It was a pleasure caring for you today.
